# Patient Record
Sex: Female | Born: 1975 | Race: Black or African American | Hispanic: No | Smoking: Current every day smoker
Health system: Southern US, Community
[De-identification: ages and names within clinical notes are randomized; demographics above are authoritative.]

## PROBLEM LIST (undated history)

## (undated) DIAGNOSIS — F1991 Other psychoactive substance use, unspecified, in remission: Secondary | ICD-10-CM

## (undated) DIAGNOSIS — F99 Mental disorder, not otherwise specified: Secondary | ICD-10-CM

## (undated) DIAGNOSIS — Z87898 Personal history of other specified conditions: Secondary | ICD-10-CM

## (undated) DIAGNOSIS — O88219 Thromboembolism in pregnancy, unspecified trimester: Secondary | ICD-10-CM

---

## 2006-01-27 ENCOUNTER — Ambulatory Visit (HOSPITAL_COMMUNITY): Admission: RE | Admit: 2006-01-27 | Discharge: 2006-01-27 | Payer: Self-pay | Admitting: Family Medicine

## 2006-02-01 ENCOUNTER — Encounter: Admission: RE | Admit: 2006-02-01 | Discharge: 2006-02-01 | Payer: Self-pay | Admitting: Orthopaedic Surgery

## 2006-09-29 ENCOUNTER — Ambulatory Visit (HOSPITAL_COMMUNITY): Admission: RE | Admit: 2006-09-29 | Discharge: 2006-09-29 | Payer: Self-pay | Admitting: Obstetrics and Gynecology

## 2006-11-19 ENCOUNTER — Ambulatory Visit (HOSPITAL_COMMUNITY): Admission: RE | Admit: 2006-11-19 | Discharge: 2006-11-19 | Payer: Self-pay | Admitting: Orthopaedic Surgery

## 2006-11-26 ENCOUNTER — Ambulatory Visit: Payer: Self-pay | Admitting: Family Medicine

## 2006-11-30 ENCOUNTER — Encounter: Admission: RE | Admit: 2006-11-30 | Discharge: 2006-12-02 | Payer: Self-pay | Admitting: Orthopaedic Surgery

## 2008-07-17 ENCOUNTER — Ambulatory Visit (HOSPITAL_COMMUNITY): Admission: RE | Admit: 2008-07-17 | Discharge: 2008-07-17 | Payer: Self-pay | Admitting: Orthopaedic Surgery

## 2008-11-28 ENCOUNTER — Ambulatory Visit (HOSPITAL_COMMUNITY): Admission: RE | Admit: 2008-11-28 | Discharge: 2008-11-29 | Payer: Self-pay | Admitting: Neurosurgery

## 2008-11-28 HISTORY — PX: BACK SURGERY: SHX140

## 2009-08-02 ENCOUNTER — Ambulatory Visit (HOSPITAL_COMMUNITY): Admission: EM | Admit: 2009-08-02 | Discharge: 2009-08-02 | Payer: Self-pay | Admitting: Emergency Medicine

## 2009-08-03 ENCOUNTER — Emergency Department (HOSPITAL_COMMUNITY): Admission: EM | Admit: 2009-08-03 | Discharge: 2009-08-04 | Payer: Self-pay | Admitting: Emergency Medicine

## 2010-07-13 ENCOUNTER — Encounter: Payer: Self-pay | Admitting: Orthopaedic Surgery

## 2010-09-11 LAB — URINALYSIS, ROUTINE W REFLEX MICROSCOPIC
Glucose, UA: NEGATIVE mg/dL
Ketones, ur: NEGATIVE mg/dL
Nitrite: NEGATIVE
Protein, ur: NEGATIVE mg/dL
Urobilinogen, UA: 1 mg/dL (ref 0.0–1.0)

## 2010-09-11 LAB — BASIC METABOLIC PANEL
BUN: 12 mg/dL (ref 6–23)
Chloride: 106 mEq/L (ref 96–112)
Creatinine, Ser: 0.98 mg/dL (ref 0.4–1.2)
Glucose, Bld: 98 mg/dL (ref 70–99)
Potassium: 4.1 mEq/L (ref 3.5–5.1)

## 2010-09-11 LAB — CBC
HCT: 40.6 % (ref 36.0–46.0)
MCHC: 34.7 g/dL (ref 30.0–36.0)
MCV: 93.5 fL (ref 78.0–100.0)
Platelets: 289 10*3/uL (ref 150–400)
RDW: 13.4 % (ref 11.5–15.5)

## 2010-09-11 LAB — CULTURE, ROUTINE-ABSCESS: Gram Stain: NONE SEEN

## 2010-09-11 LAB — DIFFERENTIAL
Basophils Absolute: 0 10*3/uL (ref 0.0–0.1)
Basophils Relative: 0 % (ref 0–1)
Eosinophils Absolute: 0.3 10*3/uL (ref 0.0–0.7)
Eosinophils Relative: 3 % (ref 0–5)

## 2010-09-11 LAB — ANAEROBIC CULTURE

## 2010-09-11 LAB — URINE MICROSCOPIC-ADD ON

## 2010-09-29 LAB — CBC
Hemoglobin: 13.8 g/dL (ref 12.0–15.0)
MCHC: 34.6 g/dL (ref 30.0–36.0)
RBC: 4.32 MIL/uL (ref 3.87–5.11)

## 2010-11-04 NOTE — Op Note (Signed)
Alicia Hardy, Alicia Hardy                 ACCOUNT NO.:  [id number]   MEDICAL RECORD NO.:  [id number]          PATIENT TYPE:  INP   LOCATION:  3536                         FACILITY:  [facility]   PHYSICIAN:  Hilda Lias, M.D.   DATE OF BIRTH:  01-Aug-1975   DATE OF PROCEDURE:  11/28/2008  DATE OF DISCHARGE:                               OPERATIVE REPORT   PREOPERATIVE DIAGNOSIS:  Left L5-S1 herniated disk.   POSTOPERATIVE DIAGNOSIS:  Left L5-S1 herniated disk.   PROCEDURE:  Left L5-S1 diskectomy, foraminotomy, microscopy.   SURGEON:  Hilda Lias, M.D.   ASSISTANT:  Clydene Fake, M.D.   INDICATION:  [name] is a 35 year old female obese, complaining of  back and left leg pain.  The patient had several conservative treatment.  This problem has been going on for several months.  X-rays showed that  she has a herniated disk at the level of L5-S1.  Surgery was advised and  the risk was explained to her and her mother.   PROCEDURE:  The patient was taken to surgery.  She was taken to the  operative room.  After intubation, she was positioned in a prone manner.  The back was cleaned with DuraPrep.  X-ray with the needle as a marker  showed that we were at the level of L5-S1.  From then on, we opened the  skin.  There was a thick adipose tissue down to the fascia.  We  identified L5-S1 and the muscles were retracted in the left side.  We  brought the microscope into the area and we drilled the open lamina of  L5 and the open of S1.  The yellow ligament also was excised.  We found  the nerve root which was swollen and drainage.  Retraction was made and  there was a herniated disk with part of the disk going to the foramen.  There was a step-off.  Then, we entered disk space and total root  diskectomy medial and lateral was done.  At the end, we had good  decompression of the L5-S1 nerve root.  Valsalva maneuver was negative.  Depo-Medrol and Fentanyl were left in the epidural space and the  wound  was closed with Vicryl and Steri-Strips.           ______________________________  Hilda Lias, M.D.     EB/MEDQ  D:  11/28/2008  T:  11/29/2008  Job:  [id number]

## 2011-01-05 ENCOUNTER — Other Ambulatory Visit (HOSPITAL_COMMUNITY): Payer: Self-pay | Admitting: Obstetrics and Gynecology

## 2011-01-05 DIAGNOSIS — O2690 Pregnancy related conditions, unspecified, unspecified trimester: Secondary | ICD-10-CM

## 2011-01-05 DIAGNOSIS — O09891 Supervision of other high risk pregnancies, first trimester: Secondary | ICD-10-CM

## 2011-01-14 ENCOUNTER — Inpatient Hospital Stay (HOSPITAL_COMMUNITY): Admission: RE | Admit: 2011-01-14 | Payer: Self-pay | Source: Ambulatory Visit

## 2011-01-14 ENCOUNTER — Ambulatory Visit (HOSPITAL_COMMUNITY): Admission: RE | Admit: 2011-01-14 | Payer: Medicaid Other | Source: Ambulatory Visit

## 2011-01-21 ENCOUNTER — Other Ambulatory Visit (HOSPITAL_COMMUNITY): Payer: Self-pay | Admitting: Obstetrics and Gynecology

## 2011-01-21 DIAGNOSIS — O2690 Pregnancy related conditions, unspecified, unspecified trimester: Secondary | ICD-10-CM

## 2011-01-21 DIAGNOSIS — Z3689 Encounter for other specified antenatal screening: Secondary | ICD-10-CM

## 2011-01-22 ENCOUNTER — Ambulatory Visit (HOSPITAL_COMMUNITY)
Admission: RE | Admit: 2011-01-22 | Discharge: 2011-01-22 | Disposition: A | Discharge status: Home / Self Care | Payer: Medicaid Other | Source: Ambulatory Visit | Attending: Obstetrics and Gynecology | Admitting: Obstetrics and Gynecology

## 2011-01-22 ENCOUNTER — Encounter (HOSPITAL_COMMUNITY): Payer: Self-pay

## 2011-01-22 ENCOUNTER — Other Ambulatory Visit (HOSPITAL_COMMUNITY): Payer: Self-pay | Admitting: Obstetrics and Gynecology

## 2011-01-22 DIAGNOSIS — Z3689 Encounter for other specified antenatal screening: Secondary | ICD-10-CM

## 2011-01-22 DIAGNOSIS — I2699 Other pulmonary embolism without acute cor pulmonale: Secondary | ICD-10-CM

## 2011-01-22 DIAGNOSIS — Z363 Encounter for antenatal screening for malformations: Secondary | ICD-10-CM | POA: Insufficient documentation

## 2011-01-22 DIAGNOSIS — F1910 Other psychoactive substance abuse, uncomplicated: Secondary | ICD-10-CM | POA: Insufficient documentation

## 2011-01-22 DIAGNOSIS — Z1389 Encounter for screening for other disorder: Secondary | ICD-10-CM | POA: Insufficient documentation

## 2011-01-22 DIAGNOSIS — O09529 Supervision of elderly multigravida, unspecified trimester: Secondary | ICD-10-CM | POA: Insufficient documentation

## 2011-01-22 DIAGNOSIS — O358XX0 Maternal care for other (suspected) fetal abnormality and damage, not applicable or unspecified: Secondary | ICD-10-CM | POA: Insufficient documentation

## 2011-01-22 DIAGNOSIS — O2690 Pregnancy related conditions, unspecified, unspecified trimester: Secondary | ICD-10-CM

## 2011-01-22 DIAGNOSIS — O88219 Thromboembolism in pregnancy, unspecified trimester: Secondary | ICD-10-CM

## 2011-01-22 HISTORY — DX: Thromboembolism in pregnancy, unspecified trimester: O88.219

## 2011-01-22 HISTORY — DX: Mental disorder, not otherwise specified: F99

## 2011-01-22 NOTE — Assessment & Plan Note (Addendum)
Continue drug free for remainder of pregnancy. Establish counseling, management of depression

## 2011-01-22 NOTE — Assessment & Plan Note (Signed)
Patient switched to unfractionated heparin 10,00 units at 8 hour intervals; has not received syringes and needles for administration.  Directed to contact office to obtain these ( suggest insulin needles and syringes and be instructed in drawing up heparin for adequate dose.  Will need APTT weekly to establish therapeutic levels (2.0-2.5 APTT upper normal), titrate heparin dose accordingly.  Stop heparin within 12 hours of anticipated delivery; may consider awaiting labor or induction of labor >39 weeks.  Past and present use of medications does not require automatic cesarean delivery; reserve for obstetic indications.

## 2011-01-22 NOTE — Assessment & Plan Note (Signed)
Counseled about warfarin exposure in pregnancy, low but real risk of fetal facial, cardiac anomalies Recommend conversion from therapeutic Lovenox to therapeutic unfractionated heparin, starting at 15,000 units q 8 hr at test to maintain APTT at 2.0-2.5 X upper normal lab values.

## 2011-01-22 NOTE — Assessment & Plan Note (Signed)
Counseled about risks of

## 2011-01-22 NOTE — Assessment & Plan Note (Addendum)
Counseled about warfarin exposure in pregnancy with U/S assessment of fetus. Continue therapeutic Lovenox 1 mg/kg during pregnancy and 6 weeks postpartum

## 2011-01-22 NOTE — Progress Notes (Signed)
Alicia Hardy is a 35 y.o. female patient.  1. Encounter for fetal anatomic survey   2. Complicated pregnancy   3. Pulmonary embolism     Past Medical History  Diagnosis Date  . Mental disorder   . Pulmonary embolus in pregnancy childbirth or puerperium     Noted: 01/22/2011    Diagnosed PE 08/2010, used warfarin for about 4 months before discovery of pregnancy into 2nd trimester.  Presently using Lovenox 1 mg/kg without problems. 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 None used since two months before consultation  Patient has stated depression component to her substance abuse, erroneously thought street benzodiazepines were to help depression. Has not had assessment for depression   Current Outpatient Prescriptions  Medication Sig Dispense Refill  . Enoxaparin Sodium (LOVENOX [place]) Inject 90 mg into the skin 2 (two) times daily.        Marland Kitchen PRENATAL VITAMINS PO Take by mouth.         No Known Allergies Active Problems:  * No active hospital problems. *   There were no vitals taken for this visit.  Review of Systems  Constitutional: Negative.   HENT: Negative.   Eyes: Negative.   Respiratory: Negative.   Cardiovascular: Negative.   Gastrointestinal: Negative.   Genitourinary: Negative.   Musculoskeletal: Negative.   Skin: Negative.   Neurological: Negative.   Endo/Heme/Allergies: Negative.   Psychiatric/Behavioral: Positive for depression.  All other systems reviewed and are negative.    Physical Exam  Nursing note reviewed.   Patient switched to unfractionated heparin 10,000 units at 8 hour intervals; has not received syringes and needles for administration. Directed to contact office to obtain these ( suggest insulin needles and syringes and be instructed in drawing up heparin for adequate dose. Will need APTT weekly to establish therapeutic levels (2.0-2.5 APTT upper normal), titrate heparin dose accordingly. Stop heparin within 12  hours of anticipated delivery; may consider awaiting labor or induction of labor >39 weeks. Past and present use of medications does not require automatic cesarean delivery; reserve for obstetic indications.   Continue drug free for remainder of pregnancy.  Establish counseling, management of depression       Brytni Dray,JOE 01/22/2011

## 2011-03-21 ENCOUNTER — Emergency Department (HOSPITAL_COMMUNITY)
Admission: EM | Admit: 2011-03-21 | Discharge: 2011-03-21 | Disposition: A | Discharge status: Home / Self Care | Payer: Medicaid Other | Attending: Emergency Medicine | Admitting: Emergency Medicine

## 2011-03-21 DIAGNOSIS — R071 Chest pain on breathing: Secondary | ICD-10-CM | POA: Insufficient documentation

## 2011-03-21 DIAGNOSIS — Z79899 Other long term (current) drug therapy: Secondary | ICD-10-CM | POA: Insufficient documentation

## 2011-03-21 DIAGNOSIS — Z86711 Personal history of pulmonary embolism: Secondary | ICD-10-CM | POA: Insufficient documentation

## 2011-03-21 DIAGNOSIS — R0989 Other specified symptoms and signs involving the circulatory and respiratory systems: Secondary | ICD-10-CM | POA: Insufficient documentation

## 2011-03-21 DIAGNOSIS — Z7901 Long term (current) use of anticoagulants: Secondary | ICD-10-CM | POA: Insufficient documentation

## 2011-03-21 DIAGNOSIS — F17200 Nicotine dependence, unspecified, uncomplicated: Secondary | ICD-10-CM | POA: Insufficient documentation

## 2011-03-21 DIAGNOSIS — R0609 Other forms of dyspnea: Secondary | ICD-10-CM | POA: Insufficient documentation

## 2011-03-21 DIAGNOSIS — R1013 Epigastric pain: Secondary | ICD-10-CM | POA: Insufficient documentation

## 2011-03-21 DIAGNOSIS — R209 Unspecified disturbances of skin sensation: Secondary | ICD-10-CM | POA: Insufficient documentation

## 2011-03-21 LAB — POCT I-STAT, CHEM 8
Chloride: 107 mEq/L (ref 96–112)
Creatinine, Ser: 0.8 mg/dL (ref 0.50–1.10)
Glucose, Bld: 100 mg/dL — ABNORMAL HIGH (ref 70–99)
Potassium: 3.4 mEq/L — ABNORMAL LOW (ref 3.5–5.1)

## 2011-03-21 LAB — APTT: aPTT: 32 seconds (ref 24–37)

## 2011-04-07 ENCOUNTER — Encounter (HOSPITAL_COMMUNITY): Payer: Self-pay

## 2013-05-22 DIAGNOSIS — M5417 Radiculopathy, lumbosacral region: Secondary | ICD-10-CM | POA: Insufficient documentation

## 2013-06-09 ENCOUNTER — Other Ambulatory Visit: Payer: Self-pay | Admitting: Neurosurgery

## 2013-06-09 DIAGNOSIS — M5416 Radiculopathy, lumbar region: Secondary | ICD-10-CM

## 2013-06-27 ENCOUNTER — Ambulatory Visit
Admission: RE | Admit: 2013-06-27 | Discharge: 2013-06-27 | Disposition: A | Discharge status: Home / Self Care | Payer: Medicaid Other | Source: Ambulatory Visit | Attending: Neurosurgery | Admitting: Neurosurgery

## 2013-06-27 DIAGNOSIS — M5416 Radiculopathy, lumbar region: Secondary | ICD-10-CM

## 2013-06-27 MED ORDER — GADOBENATE DIMEGLUMINE 529 MG/ML IV SOLN
20.0000 mL | Freq: Once | INTRAVENOUS | Status: AC | PRN
Start: 2013-06-27 — End: 2013-06-27
  Administered 2013-06-27: 20 mL via INTRAVENOUS

## 2013-08-10 DIAGNOSIS — M5414 Radiculopathy, thoracic region: Secondary | ICD-10-CM | POA: Insufficient documentation

## 2013-12-06 DIAGNOSIS — M545 Low back pain, unspecified: Secondary | ICD-10-CM | POA: Insufficient documentation

## 2014-04-23 ENCOUNTER — Encounter (HOSPITAL_COMMUNITY): Payer: Self-pay

## 2014-06-08 ENCOUNTER — Other Ambulatory Visit: Payer: Self-pay | Admitting: Neurosurgery

## 2014-06-08 DIAGNOSIS — M5417 Radiculopathy, lumbosacral region: Secondary | ICD-10-CM

## 2014-07-18 ENCOUNTER — Ambulatory Visit
Admission: RE | Admit: 2014-07-18 | Discharge: 2014-07-18 | Disposition: A | Discharge status: Home / Self Care | Payer: Medicaid Other | Source: Ambulatory Visit | Attending: Neurosurgery | Admitting: Neurosurgery

## 2014-07-18 DIAGNOSIS — M5417 Radiculopathy, lumbosacral region: Secondary | ICD-10-CM

## 2014-07-18 MED ORDER — IOHEXOL 180 MG/ML  SOLN
17.0000 mL | Freq: Once | INTRAMUSCULAR | Status: AC | PRN
  Administered 2014-07-18: 17 mL via INTRAVENOUS

## 2014-07-18 MED ORDER — DIAZEPAM 5 MG PO TABS
10.0000 mg | ORAL_TABLET | Freq: Once | ORAL | Status: AC
  Administered 2014-07-18: 10 mg via ORAL

## 2014-07-18 MED ORDER — MEPERIDINE HCL 100 MG/ML IJ SOLN
50.0000 mg | Freq: Once | INTRAMUSCULAR | Status: AC
  Administered 2014-07-18: 50 mg via INTRAMUSCULAR

## 2014-07-18 MED ORDER — ONDANSETRON HCL 4 MG/2ML IJ SOLN
4.0000 mg | Freq: Once | INTRAMUSCULAR | Status: AC
  Administered 2014-07-18: 4 mg via INTRAMUSCULAR

## 2014-07-18 NOTE — Discharge Instructions (Signed)

## 2014-07-19 ENCOUNTER — Other Ambulatory Visit: Payer: Medicaid Other

## 2018-07-21 ENCOUNTER — Other Ambulatory Visit: Payer: Self-pay | Admitting: Neurological Surgery

## 2018-07-21 DIAGNOSIS — M5416 Radiculopathy, lumbar region: Secondary | ICD-10-CM

## 2018-08-10 ENCOUNTER — Ambulatory Visit
Admission: RE | Admit: 2018-08-10 | Discharge: 2018-08-10 | Disposition: A | Discharge status: Home / Self Care | Payer: Medicaid Other | Source: Ambulatory Visit | Attending: Neurological Surgery | Admitting: Neurological Surgery

## 2018-08-10 DIAGNOSIS — M5416 Radiculopathy, lumbar region: Secondary | ICD-10-CM

## 2018-08-10 MED ORDER — IOPAMIDOL (ISOVUE-M 200) INJECTION 41%
1.0000 mL | Freq: Once | INTRAMUSCULAR | Status: AC
  Administered 2018-08-10: 1 mL via EPIDURAL

## 2018-08-10 MED ORDER — METHYLPREDNISOLONE ACETATE 40 MG/ML INJ SUSP (RADIOLOG
120.0000 mg | Freq: Once | INTRAMUSCULAR | Status: AC
  Administered 2018-08-10: 120 mg via EPIDURAL

## 2018-08-10 NOTE — Discharge Instructions (Signed)

## 2019-01-23 ENCOUNTER — Other Ambulatory Visit: Payer: Self-pay | Admitting: Neurological Surgery

## 2019-01-23 DIAGNOSIS — M5481 Occipital neuralgia: Secondary | ICD-10-CM

## 2019-01-25 ENCOUNTER — Other Ambulatory Visit: Payer: Self-pay | Admitting: Neurological Surgery

## 2019-02-14 ENCOUNTER — Other Ambulatory Visit: Payer: Self-pay | Admitting: Neurological Surgery

## 2019-02-14 ENCOUNTER — Other Ambulatory Visit (HOSPITAL_COMMUNITY): Payer: Self-pay | Admitting: Neurological Surgery

## 2019-02-14 DIAGNOSIS — M4316 Spondylolisthesis, lumbar region: Secondary | ICD-10-CM

## 2019-03-01 ENCOUNTER — Ambulatory Visit (HOSPITAL_COMMUNITY)
Admission: RE | Admit: 2019-03-01 | Discharge: 2019-03-01 | Disposition: A | Discharge status: Home / Self Care | Payer: Medicaid Other | Source: Ambulatory Visit | Attending: Neurological Surgery | Admitting: Neurological Surgery

## 2019-03-01 ENCOUNTER — Other Ambulatory Visit: Payer: Self-pay

## 2019-03-01 DIAGNOSIS — M4316 Spondylolisthesis, lumbar region: Secondary | ICD-10-CM | POA: Insufficient documentation

## 2019-03-05 ENCOUNTER — Encounter (HOSPITAL_COMMUNITY): Payer: Self-pay | Admitting: *Deleted

## 2019-03-05 ENCOUNTER — Other Ambulatory Visit: Payer: Self-pay

## 2019-03-05 NOTE — Progress Notes (Signed)
Pre-op phone call complete.  Denies cardiologist.  No recent CP, fever, SOB, cough, fever, loss taste/smell.  No vitamins, fish oil, NSAIDs.  Instructed NPO after MN, no meds DOS, no deodorant, lotions, don't bring valuable.  Discussed visitor policy and that her children would not be allowed to visit or wait in waiting area. All questions answered, verbalized understanding of instructions.

## 2019-03-06 ENCOUNTER — Other Ambulatory Visit (HOSPITAL_COMMUNITY)
Admission: RE | Admit: 2019-03-06 | Discharge: 2019-03-06 | Disposition: A | Discharge status: Home / Self Care | Payer: Medicaid Other | Source: Ambulatory Visit | Attending: Neurological Surgery | Admitting: Neurological Surgery

## 2019-03-06 LAB — SARS CORONAVIRUS 2 (TAT 6-24 HRS): SARS Coronavirus 2: NEGATIVE

## 2019-03-07 ENCOUNTER — Inpatient Hospital Stay (HOSPITAL_COMMUNITY): Payer: Medicaid Other

## 2019-03-07 ENCOUNTER — Encounter (HOSPITAL_COMMUNITY)
Admission: RE | Disposition: A | Discharge status: Home / Self Care | Payer: Self-pay | Source: Home / Self Care | Attending: Neurological Surgery

## 2019-03-07 ENCOUNTER — Inpatient Hospital Stay (HOSPITAL_COMMUNITY): Payer: Medicaid Other | Admitting: Certified Registered Nurse Anesthetist

## 2019-03-07 ENCOUNTER — Other Ambulatory Visit: Payer: Self-pay

## 2019-03-07 ENCOUNTER — Encounter (HOSPITAL_COMMUNITY): Payer: Self-pay | Admitting: *Deleted

## 2019-03-07 ENCOUNTER — Inpatient Hospital Stay (HOSPITAL_COMMUNITY)
Admission: RE | Admit: 2019-03-07 | Discharge: 2019-03-08 | DRG: 460 | Disposition: A | Discharge status: Home Health | Payer: Medicaid Other | Attending: Neurological Surgery | Admitting: Neurological Surgery

## 2019-03-07 DIAGNOSIS — Z20828 Contact with and (suspected) exposure to other viral communicable diseases: Secondary | ICD-10-CM | POA: Diagnosis present

## 2019-03-07 DIAGNOSIS — M5416 Radiculopathy, lumbar region: Secondary | ICD-10-CM | POA: Diagnosis present

## 2019-03-07 DIAGNOSIS — M48061 Spinal stenosis, lumbar region without neurogenic claudication: Secondary | ICD-10-CM | POA: Diagnosis present

## 2019-03-07 DIAGNOSIS — Z86711 Personal history of pulmonary embolism: Secondary | ICD-10-CM

## 2019-03-07 DIAGNOSIS — F17210 Nicotine dependence, cigarettes, uncomplicated: Secondary | ICD-10-CM | POA: Diagnosis present

## 2019-03-07 DIAGNOSIS — M4316 Spondylolisthesis, lumbar region: Secondary | ICD-10-CM | POA: Diagnosis present

## 2019-03-07 DIAGNOSIS — Z419 Encounter for procedure for purposes other than remedying health state, unspecified: Secondary | ICD-10-CM

## 2019-03-07 HISTORY — DX: Personal history of other specified conditions: Z87.898

## 2019-03-07 HISTORY — PX: APPLICATION OF ROBOTIC ASSISTANCE FOR SPINAL PROCEDURE: SHX6753

## 2019-03-07 HISTORY — PX: TRANSFORAMINAL LUMBAR INTERBODY FUSION W/ MIS 1 LEVEL: SHX6145

## 2019-03-07 HISTORY — DX: Other psychoactive substance use, unspecified, in remission: F19.91

## 2019-03-07 LAB — CBC
HCT: 43.4 % (ref 36.0–46.0)
Hemoglobin: 14.2 g/dL (ref 12.0–15.0)
MCH: 32 pg (ref 26.0–34.0)
MCHC: 32.7 g/dL (ref 30.0–36.0)
MCV: 97.7 fL (ref 80.0–100.0)
Platelets: 299 10*3/uL (ref 150–400)
RBC: 4.44 MIL/uL (ref 3.87–5.11)
RDW: 13.6 % (ref 11.5–15.5)
WBC: 7.3 10*3/uL (ref 4.0–10.5)
nRBC: 0 % (ref 0.0–0.2)

## 2019-03-07 LAB — TYPE AND SCREEN
ABO/RH(D): A POS
Antibody Screen: NEGATIVE

## 2019-03-07 LAB — COMPREHENSIVE METABOLIC PANEL
ALT: 14 U/L (ref 0–44)
AST: 13 U/L — ABNORMAL LOW (ref 15–41)
Albumin: 3.6 g/dL (ref 3.5–5.0)
Alkaline Phosphatase: 73 U/L (ref 38–126)
Anion gap: 11 (ref 5–15)
BUN: 9 mg/dL (ref 6–20)
CO2: 23 mmol/L (ref 22–32)
Calcium: 8.9 mg/dL (ref 8.9–10.3)
Chloride: 105 mmol/L (ref 98–111)
Creatinine, Ser: 0.77 mg/dL (ref 0.44–1.00)
GFR calc Af Amer: >60 mL/min (ref >60)
GFR calc non Af Amer: >60 mL/min (ref >60)
Glucose, Bld: 97 mg/dL (ref 70–99)
Potassium: 3.3 mmol/L — ABNORMAL LOW (ref 3.5–5.1)
Sodium: 139 mmol/L (ref 135–145)
Total Bilirubin: 0.3 mg/dL (ref 0.3–1.2)
Total Protein: 6.6 g/dL (ref 6.5–8.1)

## 2019-03-07 LAB — POCT PREGNANCY, URINE: Preg Test, Ur: NEGATIVE

## 2019-03-07 LAB — ABO/RH: ABO/RH(D): A POS

## 2019-03-07 SURGERY — MINIMALLY INVASIVE (MIS) TRANSFORAMINAL LUMBAR INTERBODY FUSION (TLIF) 1 LEVEL
Anesthesia: General | Site: Spine Lumbar

## 2019-03-07 MED ORDER — LACTATED RINGERS IV SOLN
INTRAVENOUS | Status: DC | PRN
  Administered 2019-03-07 (×2): via INTRAVENOUS

## 2019-03-07 MED ORDER — ROCURONIUM BROMIDE 10 MG/ML (PF) SYRINGE
PREFILLED_SYRINGE | INTRAVENOUS | Status: AC
  Filled 2019-03-07: qty 10

## 2019-03-07 MED ORDER — ACETAMINOPHEN 160 MG/5ML PO SOLN: 1000.0000 mg | Freq: Once | ORAL | Status: DC | PRN

## 2019-03-07 MED ORDER — ONDANSETRON HCL 4 MG/2ML IJ SOLN
INTRAMUSCULAR | Status: AC
  Filled 2019-03-07: qty 2

## 2019-03-07 MED ORDER — SODIUM CHLORIDE 0.9 % IV SOLN
INTRAVENOUS | Status: DC | PRN
  Administered 2019-03-07: 20 ug/min via INTRAVENOUS

## 2019-03-07 MED ORDER — CHLORHEXIDINE GLUCONATE CLOTH 2 % EX PADS: 6.0000 | MEDICATED_PAD | Freq: Once | CUTANEOUS | Status: DC

## 2019-03-07 MED ORDER — CEFAZOLIN SODIUM-DEXTROSE 2-4 GM/100ML-% IV SOLN
INTRAVENOUS | Status: AC
  Filled 2019-03-07: qty 100

## 2019-03-07 MED ORDER — ACETAMINOPHEN 500 MG PO TABS: 1000.0000 mg | ORAL_TABLET | Freq: Once | ORAL | Status: DC | PRN

## 2019-03-07 MED ORDER — 0.9 % SODIUM CHLORIDE (POUR BTL) OPTIME
TOPICAL | Status: DC | PRN
  Administered 2019-03-07: 1000 mL

## 2019-03-07 MED ORDER — ROCURONIUM BROMIDE 50 MG/5ML IV SOSY
PREFILLED_SYRINGE | INTRAVENOUS | Status: DC | PRN
  Administered 2019-03-07 (×2): 20 mg via INTRAVENOUS
  Administered 2019-03-07 (×2): 10 mg via INTRAVENOUS
  Administered 2019-03-07: 20 mg via INTRAVENOUS
  Administered 2019-03-07: 50 mg via INTRAVENOUS

## 2019-03-07 MED ORDER — FENTANYL CITRATE (PF) 250 MCG/5ML IJ SOLN
INTRAMUSCULAR | Status: AC
  Filled 2019-03-07: qty 5

## 2019-03-07 MED ORDER — LIDOCAINE 2% (20 MG/ML) 5 ML SYRINGE
INTRAMUSCULAR | Status: DC | PRN
  Administered 2019-03-07: 80 mg via INTRAVENOUS

## 2019-03-07 MED ORDER — SUCCINYLCHOLINE CHLORIDE 200 MG/10ML IV SOSY
PREFILLED_SYRINGE | INTRAVENOUS | Status: AC
  Filled 2019-03-07: qty 10

## 2019-03-07 MED ORDER — LIDOCAINE 2% (20 MG/ML) 5 ML SYRINGE
INTRAMUSCULAR | Status: AC
  Filled 2019-03-07: qty 10

## 2019-03-07 MED ORDER — ACETAMINOPHEN 650 MG RE SUPP: 650.0000 mg | RECTAL | Status: DC | PRN

## 2019-03-07 MED ORDER — OXYCODONE HCL 5 MG PO TABS: 5.0000 mg | ORAL_TABLET | ORAL | Status: DC | PRN

## 2019-03-07 MED ORDER — DEXAMETHASONE SODIUM PHOSPHATE 10 MG/ML IJ SOLN
INTRAMUSCULAR | Status: AC
  Filled 2019-03-07: qty 1

## 2019-03-07 MED ORDER — OXYCODONE HCL 5 MG PO TABS
10.0000 mg | ORAL_TABLET | ORAL | Status: DC | PRN
  Administered 2019-03-07 – 2019-03-08 (×5): 10 mg via ORAL
  Filled 2019-03-07 (×5): qty 2

## 2019-03-07 MED ORDER — CEFAZOLIN SODIUM-DEXTROSE 2-4 GM/100ML-% IV SOLN
2.0000 g | INTRAVENOUS | Status: AC
  Administered 2019-03-07 (×2): 2 g via INTRAVENOUS

## 2019-03-07 MED ORDER — ONDANSETRON HCL 4 MG PO TABS: 4.0000 mg | ORAL_TABLET | Freq: Four times a day (QID) | ORAL | Status: DC | PRN

## 2019-03-07 MED ORDER — SUCCINYLCHOLINE CHLORIDE 200 MG/10ML IV SOSY
PREFILLED_SYRINGE | INTRAVENOUS | Status: DC | PRN
  Administered 2019-03-07: 110 mg via INTRAVENOUS

## 2019-03-07 MED ORDER — ONDANSETRON HCL 4 MG/2ML IJ SOLN
INTRAMUSCULAR | Status: DC | PRN
  Administered 2019-03-07: 4 mg via INTRAVENOUS

## 2019-03-07 MED ORDER — DOCUSATE SODIUM 100 MG PO CAPS
100.0000 mg | ORAL_CAPSULE | Freq: Two times a day (BID) | ORAL | Status: DC
  Administered 2019-03-07 – 2019-03-08 (×2): 100 mg via ORAL
  Filled 2019-03-07 (×2): qty 1

## 2019-03-07 MED ORDER — LIDOCAINE-EPINEPHRINE 1 %-1:100000 IJ SOLN
INTRAMUSCULAR | Status: DC | PRN
  Administered 2019-03-07: 4 mL

## 2019-03-07 MED ORDER — SODIUM CHLORIDE 0.9 % IV SOLN
INTRAVENOUS | Status: DC | PRN
  Administered 2019-03-07: 500 mL

## 2019-03-07 MED ORDER — FENTANYL CITRATE (PF) 250 MCG/5ML IJ SOLN
INTRAMUSCULAR | Status: DC | PRN
  Administered 2019-03-07 (×4): 50 ug via INTRAVENOUS
  Administered 2019-03-07: 100 ug via INTRAVENOUS

## 2019-03-07 MED ORDER — LIDOCAINE-EPINEPHRINE 1 %-1:100000 IJ SOLN
INTRAMUSCULAR | Status: AC
  Filled 2019-03-07: qty 1

## 2019-03-07 MED ORDER — ACETAMINOPHEN 10 MG/ML IV SOLN
INTRAVENOUS | Status: AC
  Filled 2019-03-07: qty 100

## 2019-03-07 MED ORDER — OXYCODONE HCL 5 MG PO TABS
ORAL_TABLET | ORAL | Status: AC
  Filled 2019-03-07: qty 1

## 2019-03-07 MED ORDER — LIDOCAINE 2% (20 MG/ML) 5 ML SYRINGE
INTRAMUSCULAR | Status: AC
  Filled 2019-03-07: qty 5

## 2019-03-07 MED ORDER — SODIUM CHLORIDE 0.9% FLUSH: 3.0000 mL | INTRAVENOUS | Status: DC | PRN

## 2019-03-07 MED ORDER — CEFAZOLIN SODIUM-DEXTROSE 2-4 GM/100ML-% IV SOLN
2.0000 g | Freq: Three times a day (TID) | INTRAVENOUS | Status: AC
  Administered 2019-03-07 – 2019-03-08 (×2): 2 g via INTRAVENOUS
  Filled 2019-03-07 (×2): qty 100

## 2019-03-07 MED ORDER — ACETAMINOPHEN 325 MG PO TABS
650.0000 mg | ORAL_TABLET | ORAL | Status: DC | PRN
  Administered 2019-03-07: 650 mg via ORAL
  Filled 2019-03-07: qty 2

## 2019-03-07 MED ORDER — HYDROMORPHONE HCL 1 MG/ML IJ SOLN
0.2500 mg | INTRAMUSCULAR | Status: AC | PRN
  Administered 2019-03-07 (×4): 0.5 mg via INTRAVENOUS

## 2019-03-07 MED ORDER — MENTHOL 3 MG MT LOZG: 1.0000 | LOZENGE | OROMUCOSAL | Status: DC | PRN

## 2019-03-07 MED ORDER — ALBUMIN HUMAN 5 % IV SOLN
INTRAVENOUS | Status: DC | PRN
  Administered 2019-03-07: 13:00:00 via INTRAVENOUS

## 2019-03-07 MED ORDER — HYDROMORPHONE HCL 1 MG/ML IJ SOLN
INTRAMUSCULAR | Status: AC
  Filled 2019-03-07: qty 1

## 2019-03-07 MED ORDER — OXYCODONE HCL 5 MG/5ML PO SOLN: 5.0000 mg | Freq: Once | ORAL | Status: AC | PRN

## 2019-03-07 MED ORDER — PROPOFOL 10 MG/ML IV BOLUS
INTRAVENOUS | Status: DC | PRN
  Administered 2019-03-07: 200 mg via INTRAVENOUS

## 2019-03-07 MED ORDER — LACTATED RINGERS IV SOLN
INTRAVENOUS | Status: DC
  Administered 2019-03-07 (×2): via INTRAVENOUS

## 2019-03-07 MED ORDER — SODIUM CHLORIDE 0.9% FLUSH
3.0000 mL | Freq: Two times a day (BID) | INTRAVENOUS | Status: DC
  Administered 2019-03-07: 21:00:00 3 mL via INTRAVENOUS

## 2019-03-07 MED ORDER — PHENYLEPHRINE 40 MCG/ML (10ML) SYRINGE FOR IV PUSH (FOR BLOOD PRESSURE SUPPORT)
PREFILLED_SYRINGE | INTRAVENOUS | Status: AC
  Filled 2019-03-07: qty 10

## 2019-03-07 MED ORDER — ONDANSETRON HCL 4 MG/2ML IJ SOLN: 4.0000 mg | Freq: Four times a day (QID) | INTRAMUSCULAR | Status: DC | PRN

## 2019-03-07 MED ORDER — METHOCARBAMOL 500 MG PO TABS
500.0000 mg | ORAL_TABLET | Freq: Four times a day (QID) | ORAL | Status: DC | PRN
  Administered 2019-03-07 – 2019-03-08 (×2): 500 mg via ORAL
  Filled 2019-03-07 (×2): qty 1

## 2019-03-07 MED ORDER — ONDANSETRON HCL 4 MG/2ML IJ SOLN
INTRAMUSCULAR | Status: AC
  Filled 2019-03-07: qty 4

## 2019-03-07 MED ORDER — PHENYLEPHRINE HCL (PRESSORS) 10 MG/ML IV SOLN
INTRAVENOUS | Status: DC | PRN
  Administered 2019-03-07: 40 ug via INTRAVENOUS
  Administered 2019-03-07 (×2): 80 ug via INTRAVENOUS
  Administered 2019-03-07: 40 ug via INTRAVENOUS

## 2019-03-07 MED ORDER — THROMBIN 5000 UNITS EX SOLR
OROMUCOSAL | Status: DC | PRN
  Administered 2019-03-07: 5 mL via TOPICAL

## 2019-03-07 MED ORDER — MIDAZOLAM HCL 5 MG/5ML IJ SOLN
INTRAMUSCULAR | Status: DC | PRN
  Administered 2019-03-07: 2 mg via INTRAVENOUS

## 2019-03-07 MED ORDER — FENTANYL CITRATE (PF) 100 MCG/2ML IJ SOLN: 25.0000 ug | INTRAMUSCULAR | Status: DC | PRN

## 2019-03-07 MED ORDER — HYDROMORPHONE HCL 1 MG/ML IJ SOLN: 0.5000 mg | INTRAMUSCULAR | Status: DC | PRN

## 2019-03-07 MED ORDER — OXYCODONE HCL 5 MG PO TABS
5.0000 mg | ORAL_TABLET | Freq: Once | ORAL | Status: AC | PRN
  Administered 2019-03-07: 5 mg via ORAL

## 2019-03-07 MED ORDER — THROMBIN 5000 UNITS EX SOLR
CUTANEOUS | Status: AC
  Filled 2019-03-07: qty 5000

## 2019-03-07 MED ORDER — SODIUM CHLORIDE 0.9 % IV SOLN
250.0000 mL | INTRAVENOUS | Status: DC
  Administered 2019-03-07: 250 mL via INTRAVENOUS

## 2019-03-07 MED ORDER — ROCURONIUM BROMIDE 10 MG/ML (PF) SYRINGE
PREFILLED_SYRINGE | INTRAVENOUS | Status: AC
  Filled 2019-03-07: qty 20

## 2019-03-07 MED ORDER — POLYETHYLENE GLYCOL 3350 17 G PO PACK: 17.0000 g | PACK | Freq: Every day | ORAL | Status: DC | PRN

## 2019-03-07 MED ORDER — DEXAMETHASONE SODIUM PHOSPHATE 10 MG/ML IJ SOLN
INTRAMUSCULAR | Status: DC | PRN
  Administered 2019-03-07: 5 mg via INTRAVENOUS

## 2019-03-07 MED ORDER — ACETAMINOPHEN 10 MG/ML IV SOLN
1000.0000 mg | Freq: Once | INTRAVENOUS | Status: DC | PRN
  Administered 2019-03-07: 1000 mg via INTRAVENOUS

## 2019-03-07 MED ORDER — MIDAZOLAM HCL 2 MG/2ML IJ SOLN
INTRAMUSCULAR | Status: AC
  Filled 2019-03-07: qty 2

## 2019-03-07 MED ORDER — BACLOFEN 10 MG PO TABS
10.0000 mg | ORAL_TABLET | Freq: Every day | ORAL | Status: DC
  Administered 2019-03-07: 10 mg via ORAL
  Filled 2019-03-07: qty 1

## 2019-03-07 MED ORDER — PHENOL 1.4 % MT LIQD: 1.0000 | OROMUCOSAL | Status: DC | PRN

## 2019-03-07 SURGICAL SUPPLY — 95 items
BAG DECANTER FOR FLEXI CONT (MISCELLANEOUS) ×4 IMPLANT
BASKET BONE COLLECTION (BASKET) ×2 IMPLANT
BIT DRILL LONG 3.0X30 (BIT) ×1 IMPLANT
BIT DRILL LONG 3.0X30MM (BIT) ×1
BIT DRILL LONG 3X80 (BIT) IMPLANT
BIT DRILL LONG 3X80MM (BIT)
BIT DRILL LONG 4X80 (BIT) IMPLANT
BIT DRILL LONG 4X80MM (BIT)
BIT DRILL SHORT 3.0X30 (BIT) IMPLANT
BIT DRILL SHORT 3.0X30MM (BIT)
BIT DRILL SHORT 3X80 (BIT) IMPLANT
BIT DRILL SHORT 3X80MM (BIT)
BLADE CLIPPER SURG (BLADE) IMPLANT
BLADE SURG 11 STRL SS (BLADE) ×6 IMPLANT
BUR MATCHSTICK NEURO 3.0 LAGG (BURR) ×2 IMPLANT
BUR PRECISION FLUTE 5.0 (BURR) ×2 IMPLANT
CATH FOLEY 2WAY SLVR  5CC 14FR (CATHETERS)
CATH FOLEY 2WAY SLVR  5CC 16FR (CATHETERS) ×2
CATH FOLEY 2WAY SLVR 5CC 14FR (CATHETERS) IMPLANT
CATH FOLEY 2WAY SLVR 5CC 16FR (CATHETERS) IMPLANT
CONT SPEC 4OZ CLIKSEAL STRL BL (MISCELLANEOUS) ×4 IMPLANT
COVER BACK TABLE 60X90IN (DRAPES) ×4 IMPLANT
COVER WAND RF STERILE (DRAPES) ×6 IMPLANT
DECANTER SPIKE VIAL GLASS SM (MISCELLANEOUS) ×4 IMPLANT
DERMABOND ADVANCED (GAUZE/BANDAGES/DRESSINGS) ×2
DERMABOND ADVANCED .7 DNX12 (GAUZE/BANDAGES/DRESSINGS) ×2 IMPLANT
DEVICE INTERBODY ELEVATE 23X8 (Cage) ×2 IMPLANT
DRAPE C-ARM 42X72 X-RAY (DRAPES) ×6 IMPLANT
DRAPE C-ARMOR (DRAPES) ×2 IMPLANT
DRAPE LAPAROTOMY 100X72X124 (DRAPES) ×4 IMPLANT
DRAPE MICROSCOPE LEICA (MISCELLANEOUS) ×2 IMPLANT
DRAPE SHEET LG 3/4 BI-LAMINATE (DRAPES) ×4 IMPLANT
DRAPE SURG 17X23 STRL (DRAPES) ×8 IMPLANT
ELECT BLADE 4.0 EZ CLEAN MEGAD (MISCELLANEOUS)
ELECT BLADE 6.5 EXT (BLADE) ×4 IMPLANT
ELECT REM PT RETURN 9FT ADLT (ELECTROSURGICAL) ×4
ELECTRODE BLDE 4.0 EZ CLN MEGD (MISCELLANEOUS) IMPLANT
ELECTRODE REM PT RTRN 9FT ADLT (ELECTROSURGICAL) ×2 IMPLANT
EXTENDER TAB GUIDE SV 5.5/6.0 (INSTRUMENTS) ×16 IMPLANT
GAUZE 4X4 16PLY RFD (DISPOSABLE) ×2 IMPLANT
GAUZE SPONGE 4X4 12PLY STRL (GAUZE/BANDAGES/DRESSINGS) ×4 IMPLANT
GLOVE BIO SURGEON STRL SZ7.5 (GLOVE) ×4 IMPLANT
GLOVE BIOGEL PI IND STRL 6.5 (GLOVE) IMPLANT
GLOVE BIOGEL PI IND STRL 7.0 (GLOVE) IMPLANT
GLOVE BIOGEL PI IND STRL 7.5 (GLOVE) ×2 IMPLANT
GLOVE BIOGEL PI IND STRL 8 (GLOVE) IMPLANT
GLOVE BIOGEL PI INDICATOR 6.5 (GLOVE) ×2
GLOVE BIOGEL PI INDICATOR 7.0 (GLOVE) ×6
GLOVE BIOGEL PI INDICATOR 7.5 (GLOVE) ×8
GLOVE BIOGEL PI INDICATOR 8 (GLOVE) ×2
GLOVE ECLIPSE 7.5 STRL STRAW (GLOVE) ×8 IMPLANT
GLOVE EXAM NITRILE LRG STRL (GLOVE) IMPLANT
GLOVE EXAM NITRILE XL STR (GLOVE) IMPLANT
GLOVE EXAM NITRILE XS STR PU (GLOVE) IMPLANT
GLOVE SURG SS PI 6.0 STRL IVOR (GLOVE) ×6 IMPLANT
GOWN STRL REUS W/ TWL LRG LVL3 (GOWN DISPOSABLE) ×2 IMPLANT
GOWN STRL REUS W/ TWL XL LVL3 (GOWN DISPOSABLE) IMPLANT
GOWN STRL REUS W/TWL 2XL LVL3 (GOWN DISPOSABLE) ×4 IMPLANT
GOWN STRL REUS W/TWL LRG LVL3 (GOWN DISPOSABLE) ×6
GOWN STRL REUS W/TWL XL LVL3 (GOWN DISPOSABLE) ×2
GUIDEWIRE 18IN BLUNT CD HORIZ (WIRE) ×8 IMPLANT
HEMOSTAT POWDER KIT SURGIFOAM (HEMOSTASIS) ×4 IMPLANT
KIT BASIN OR (CUSTOM PROCEDURE TRAY) ×4 IMPLANT
KIT POSITION SURG JACKSON T1 (MISCELLANEOUS) ×4 IMPLANT
KIT SPINE MAZOR X ROBO DISP (MISCELLANEOUS) ×4 IMPLANT
KIT TURNOVER KIT B (KITS) IMPLANT
NDL HYPO 18GX1.5 BLUNT FILL (NEEDLE) IMPLANT
NDL SPNL 18GX3.5 QUINCKE PK (NEEDLE) IMPLANT
NEEDLE HYPO 18GX1.5 BLUNT FILL (NEEDLE) IMPLANT
NEEDLE HYPO 22GX1.5 SAFETY (NEEDLE) ×4 IMPLANT
NEEDLE SPNL 18GX3.5 QUINCKE PK (NEEDLE) IMPLANT
NS IRRIG 1000ML POUR BTL (IV SOLUTION) ×4 IMPLANT
PACK LAMINECTOMY NEURO (CUSTOM PROCEDURE TRAY) ×4 IMPLANT
PAD ARMBOARD 7.5X6 YLW CONV (MISCELLANEOUS) ×8 IMPLANT
PIN HEAD 2.5X60MM (PIN) IMPLANT
ROD 5.5 CCM PERC 40 (Rod) ×2 IMPLANT
ROD 5.5X45MM SOLERA VOYAGER (Rod) ×2 IMPLANT
RUBBERBAND STERILE (MISCELLANEOUS) ×4 IMPLANT
SCREW MAS FENS 6.5 45 (Screw) IMPLANT
SCREW MAS FENS 6.5X45 (Screw) ×12 IMPLANT
SCREW SCHANZ SA 4.0MM (MISCELLANEOUS) IMPLANT
SCREW SET 5.5/6.0MM SOLERA (Screw) ×8 IMPLANT
SPACER SPNL STD 23X8XSTRL (Cage) IMPLANT
SPCR SPNL STD 23X8XSTRL (Cage) ×2 IMPLANT
SPONGE LAP 4X18 RFD (DISPOSABLE) IMPLANT
SUT MNCRL AB 3-0 PS2 18 (SUTURE) ×6 IMPLANT
SUT VIC AB 0 CT1 18XCR BRD8 (SUTURE) IMPLANT
SUT VIC AB 0 CT1 8-18 (SUTURE)
SUT VIC AB 2-0 CP2 18 (SUTURE) ×8 IMPLANT
SYR 3ML LL SCALE MARK (SYRINGE) IMPLANT
TOWEL GREEN STERILE (TOWEL DISPOSABLE) ×4 IMPLANT
TOWEL GREEN STERILE FF (TOWEL DISPOSABLE) ×4 IMPLANT
TRAY FOLEY MTR SLVR 16FR STAT (SET/KITS/TRAYS/PACK) ×2 IMPLANT
TUBE MAZOR SA REDUCTION (TUBING) ×4 IMPLANT
WATER STERILE IRR 1000ML POUR (IV SOLUTION) ×4 IMPLANT

## 2019-03-07 NOTE — Anesthesia Procedure Notes (Signed)
Procedure Name: Intubation Date/Time: 03/07/2019 10:49 AM Performed by: Glynda Jaeger, CRNA Pre-anesthesia Checklist: Patient identified, Patient being monitored, Timeout performed, Emergency Drugs available and Suction available Patient Re-evaluated:Patient Re-evaluated prior to induction Oxygen Delivery Method: Circle System Utilized Preoxygenation: Pre-oxygenation with 100% oxygen Induction Type: IV induction Laryngoscope Size: Glidescope and 4 Grade View: Grade I Tube type: Oral Tube size: 7.5 mm Number of attempts: 1 Airway Equipment and Method: Video-laryngoscopy Placement Confirmation: ETT inserted through vocal cords under direct vision,  positive ETCO2 and breath sounds checked- equal and bilateral Secured at: 21 cm Tube secured with: Tape Dental Injury: Teeth and Oropharynx as per pre-operative assessment

## 2019-03-07 NOTE — Progress Notes (Signed)
Orthopedic Tech Progress Note Patient Details:  Alicia Hardy 09/13/1975 [id number]  Patient ID: [name], female   DOB: 1976-04-18, 43 y.o.   MRN: [id number]   [name] 03/07/2019, 6:14 PMBio-Tech dropping brace off tomorrow  Morning.

## 2019-03-07 NOTE — Op Note (Signed)
PATIENT: Alicia Hardy  DAY OF SURGERY: 03/07/19   PRE-OPERATIVE DIAGNOSIS:  Lumbar radiculopathy, lumbar spondylolisthesis   POST-OPERATIVE DIAGNOSIS:  Lumbar radiculopathy, lumbar spondylolisthesis   PROCEDURE:  L4-L5 minimally invasive transforaminal lumbar interbody fusion with bilateral L4-L5 pedicle screw placement, L4 laminectomy with bilateral L4-5 facetectomies   SURGEON:  Surgeon(s) and Role:    Judith Part, MD - Primary   ANESTHESIA: ETGA   BRIEF HISTORY: This is a 43 year old woman who presented with bilateral leg and low back pain. The patient was found to have a mobile L4-5 spondylolisthesis with bilateral foraminal and lateral recess stenosis. This was discussed with the patient as well as risks, benefits, and alternatives and wished to proceed with surgical treatment.   OPERATIVE DETAIL: The patient was taken to the operating room and placed on the OR table in the prone position. A formal time out was performed with two patient identifiers and confirmed the operative site. Anesthesia was induced by the anesthesia team. The operative site was marked, hair was clipped with surgical clippers, the area was then prepped and draped in a sterile fashion.   The Mazor robot was attached to the table and draped in a sterile fashion. The robot was registered and, using fluoroscopy as needed, the four pedicle insertion sites were marked and incisions were created to connect those insertion sites. The robotic arm was used to help guide K wires into the bilateral L4 and L5 pedicles, which were then secured. The microscope was draped in a sterile fashion and brought into the field.   A [employer] tube was then docked to the right L4-5 facet through the same incision using fluoroscopy. A left L4-5 facetectomy was performed and the left L4 nerve root was decompressed along its entire course. The tube was wanded medially and the right lateral recess was decompressed. The tube was removed and  hemostasis was obtained during its removal.   A [employer] tube was then docked to the left L4-5 facet through the pedicle screw incision using fluoroscopy. A right L4-5 facetectomy was performed and the right L4 nerve root was decompressed along its entire course. The traversing and exiting nerve roots were identified and protected during all disc space work. The disc space was identified, incised, and a discectomy was performed in the standard fashion. The endplates were prepped, bone graft was packed into the disc space, and an expandable cage was packed with autograft and placed into the disc space with fluoroscopic confirmation. The tube was wanded medially and the left hemi-lamina was removed to decompress the lateral recess. The tube was removed and hemostasis was obtained during its removal.   Using the K wires, a tap and then screw with tower were placed bilaterally at L4 and L5. A rod was sized and introduced on both sides, confirmed with fluoroscopy, then final tightened. Hemostasis was again confirmed for both incisions, they were copiously irrigated, and then closed in layers.   All instrument and sponge counts were correct. The patient was then returned to anesthesia for emergence. No apparent complications at the completion of the procedure.   EBL:  128mL   DRAINS: none   SPECIMENS: none   Judith Part, MD 03/07/19 11:03 AM

## 2019-03-07 NOTE — Transfer of Care (Signed)
Immediate Anesthesia Transfer of Care Note  Patient: Alicia Hardy  Procedure(s) Performed: Left Lumbar four-five minimally invasive transforaminal lumbar interbody fusion with Lumbar four-five Bilateral pedicle screw placement (Left Spine Lumbar) APPLICATION OF ROBOTIC ASSISTANCE FOR SPINAL PROCEDURE (N/A Spine Lumbar)  Patient Location: PACU  Anesthesia Type:General  Level of Consciousness: awake, alert , oriented and patient cooperative  Airway & Oxygen Therapy: Patient Spontanous Breathing  Post-op Assessment: Report given to RN and Post -op Vital signs reviewed and stable  Post vital signs: Reviewed and stable  Last Vitals:  Vitals Value Taken Time  BP 126/79 03/07/19 1634  Temp    Pulse 80 03/07/19 1638  Resp 17 03/07/19 1638  SpO2 93 % 03/07/19 1638  Vitals shown include unvalidated device data.  Last Pain:  Vitals:   03/07/19 0917  TempSrc:   PainSc: 10-Worst pain ever      Patients Stated Pain Goal: 7 (25/00/37 0488)  Complications: No apparent anesthesia complications

## 2019-03-07 NOTE — Anesthesia Preprocedure Evaluation (Signed)
Anesthesia Evaluation  Patient identified by MRN, date of birth, ID band Patient awake    Reviewed: Allergy & Precautions, NPO status , Patient's Chart, lab work & pertinent test results  History of Anesthesia Complications Negative for: history of anesthetic complications  Airway Mallampati: II  TM Distance: >3 FB Neck ROM: Full    Dental  (+) Dental Advisory Given   Pulmonary Current Smoker and Patient abstained from smoking.,    breath sounds clear to auscultation       Cardiovascular negative cardio ROS   Rhythm:Regular     Neuro/Psych PSYCHIATRIC DISORDERS  Neuromuscular disease    GI/Hepatic negative GI ROS, (+)     substance abuse  cocaine use and marijuana use,   Endo/Other  negative endocrine ROS  Renal/GU negative Renal ROS     Musculoskeletal   Abdominal   Peds  Hematology negative hematology ROS (+)   Anesthesia Other Findings   Reproductive/Obstetrics                             Anesthesia Physical Anesthesia Plan  ASA: II  Anesthesia Plan: General   Post-op Pain Management:    Induction: Intravenous  PONV Risk Score and Plan: 2 and Ondansetron and Dexamethasone  Airway Management Planned: Oral ETT  Additional Equipment: None  Intra-op Plan:   Post-operative Plan: Extubation in OR  Informed Consent: I have reviewed the patients History and Physical, chart, labs and discussed the procedure including the risks, benefits and alternatives for the proposed anesthesia with the patient or authorized representative who has indicated his/her understanding and acceptance.     Dental advisory given  Plan Discussed with: CRNA and Surgeon  Anesthesia Plan Comments:         Anesthesia Quick Evaluation

## 2019-03-07 NOTE — Brief Op Note (Signed)
03/07/2019  4:28 PM  PATIENT:  Alicia Hardy  43 y.o. female  PRE-OPERATIVE DIAGNOSIS:  Spondylolisthesis, Lumbar region  POST-OPERATIVE DIAGNOSIS:  Spondylolisthesis, Lumbar region  PROCEDURE:  Procedure(s): Left Lumbar four-five minimally invasive transforaminal lumbar interbody fusion with Lumbar four-five Bilateral pedicle screw placement (Left) APPLICATION OF ROBOTIC ASSISTANCE FOR SPINAL PROCEDURE (N/A)  SURGEON:  Surgeon(s) and Role:    * Judith Part, MD - Primary  PHYSICIAN ASSISTANT:   ASSISTANTS: none   ANESTHESIA:   general  EBL:  175 mL   BLOOD ADMINISTERED:none  DRAINS: none   LOCAL MEDICATIONS USED:  LIDOCAINE   SPECIMEN:  No Specimen  DISPOSITION OF SPECIMEN:  N/A  COUNTS:  YES  TOURNIQUET:  * No tourniquets in log *  DICTATION: .Note written in EPIC  PLAN OF CARE: Admit to inpatient   PATIENT DISPOSITION:  PACU - hemodynamically stable.   Delay start of Pharmacological VTE agent (>24hrs) due to surgical blood loss or risk of bleeding: yes

## 2019-03-07 NOTE — H&P (Signed)
Surgical H&P Update  HPI: 43 y.o. woman with a history of low back and left leg pain, here for surgical treatment. Radiographic workup revealed a mobile grade 1 L4-5 spondylolisthesis with bilateral lateral recess stenosis and foraminal stenosis. No changes in health since she was last seen. Still having symptoms and wishes to proceed with surgery.  PMHx:  Past Medical History:  Diagnosis Date  . History of drug use   . Mental disorder   . Pulmonary embolus in pregnancy childbirth or puerperium    FamHx: History reviewed. No pertinent family history. SocHx:  reports that she has been smoking cigarettes. She has been smoking about 0.25 packs per day. She has never used smokeless tobacco. She reports current drug use. Drugs: Cocaine, Marijuana, and Benzodiazepines. She reports that she does not drink alcohol.  Physical Exam: AOx3, PERRL, FS, TM  Strength 5/5 x4, SILTx4 except for left L4 distribution numbness  Assesment/Plan: 43 y.o. woman with back and leg pain 2/2 L4-5 mobile spondylolisthesis, here for L4-5 MIS TLIF. Risks, benefits, and alternatives discussed and the patient would like to continue with surgery.  -OR today -3C post-op  Judith Part, MD 03/07/19 9:57 AM

## 2019-03-08 ENCOUNTER — Encounter (HOSPITAL_COMMUNITY): Payer: Self-pay | Admitting: Neurological Surgery

## 2019-03-08 MED ORDER — METHOCARBAMOL 500 MG PO TABS: 500.0000 mg | ORAL_TABLET | Freq: Four times a day (QID) | ORAL | 0 refills | Status: DC | PRN

## 2019-03-08 MED ORDER — OXYCODONE HCL 5 MG PO TABS: 5.0000 mg | ORAL_TABLET | ORAL | 0 refills | Status: DC | PRN

## 2019-03-08 NOTE — Progress Notes (Signed)
Neurosurgery Service Progress Note  Subjective: No acute events overnight, leg pain and back pain significantly improved, but feels that her foot numbness is worse post-op   Objective: Vitals:   03/07/19 2106 03/08/19 0004 03/08/19 0338 03/08/19 0741  BP: 133/90 (!) 156/82 136/85 107/68  Pulse: 68 64 66 62  Resp:  18 18 16   Temp: 98 F (36.7 C) 98 F (36.7 C) 98.1 F (36.7 C) 97.7 F (36.5 C)  TempSrc: Oral Oral Oral Oral  SpO2: 100% 96% 100% 98%  Weight:      Height:       Temp (24hrs), Avg:97.8 F (36.6 C), Min:97 F (36.1 C), Max:98.1 F (36.7 C)  CBC Latest Ref Rng & Units 03/07/2019 03/21/2011 08/02/2009  WBC 4.0 - 10.5 K/uL 7.3 - 9.0  Hemoglobin 12.0 - 15.0 g/dL 16.114.2 09.614.3 04.514.1  Hematocrit 36.0 - 46.0 % 43.4 42.0 40.6  Platelets 150 - 400 K/uL 299 - 289   BMP Latest Ref Rng & Units 03/07/2019 03/21/2011 08/02/2009  Glucose 70 - 99 mg/dL 97 409(W100(H) 98  BUN 6 - 20 mg/dL 9 11 12   Creatinine 0.44 - 1.00 mg/dL 1.190.77 1.470.80 8.290.98  Sodium 135 - 145 mmol/L 139 141 139  Potassium 3.5 - 5.1 mmol/L 3.3(L) 3.4(L) 4.1  Chloride 98 - 111 mmol/L 105 107 106  CO2 22 - 32 mmol/L 23 - 27  Calcium 8.9 - 10.3 mg/dL 8.9 - 9.3    Intake/Output Summary (Last 24 hours) at 03/08/2019 0913 Last data filed at 03/07/2019 2100 Gross per 24 hour  Intake 2450 ml  Output 680 ml  Net 1770 ml    Current Facility-Administered Medications:  .  0.9 %  sodium chloride infusion, 250 mL, Intravenous, Continuous, Ekta Dancer A, MD, Last Rate: 50 mL/hr at 03/07/19 1816, 250 mL at 03/07/19 1816 .  acetaminophen (TYLENOL) tablet 650 mg, 650 mg, Oral, Q4H PRN, 650 mg at 03/07/19 2312 **OR** acetaminophen (TYLENOL) suppository 650 mg, 650 mg, Rectal, Q4H PRN, Jadene Pierinistergard, Aleesha Ringstad A, MD .  baclofen (LIORESAL) tablet 10 mg, 10 mg, Oral, QHS, Bascom Biel, Clovis Puhomas A, MD, 10 mg at 03/07/19 2035 .  docusate sodium (COLACE) capsule 100 mg, 100 mg, Oral, BID, Jadene Pierinistergard, Kirsten Spearing A, MD, 100 mg at 03/07/19 2036 .   HYDROmorphone (DILAUDID) injection 0.5 mg, 0.5 mg, Intravenous, Q3H PRN, Alithea Lapage A, MD .  menthol-cetylpyridinium (CEPACOL) lozenge 3 mg, 1 lozenge, Oral, PRN **OR** phenol (CHLORASEPTIC) mouth spray 1 spray, 1 spray, Mouth/Throat, PRN, Damani Rando A, MD .  methocarbamol (ROBAXIN) tablet 500 mg, 500 mg, Oral, Q6H PRN, Jadene Pierinistergard, Yaslyn Cumby A, MD, 500 mg at 03/08/19 0636 .  ondansetron (ZOFRAN) tablet 4 mg, 4 mg, Oral, Q6H PRN **OR** ondansetron (ZOFRAN) injection 4 mg, 4 mg, Intravenous, Q6H PRN, Teila Skalsky A, MD .  oxyCODONE (Oxy IR/ROXICODONE) immediate release tablet 10 mg, 10 mg, Oral, Q4H PRN, Jadene Pierinistergard, Ota Ebersole A, MD, 10 mg at 03/08/19 [id number] .  oxyCODONE (Oxy IR/ROXICODONE) immediate release tablet 5 mg, 5 mg, Oral, Q4H PRN, Glynda Soliday A, MD .  polyethylene glycol (MIRALAX / GLYCOLAX) packet 17 g, 17 g, Oral, Daily PRN, Ceara Wrightson A, MD .  sodium chloride flush (NS) 0.9 % injection 3 mL, 3 mL, Intravenous, Q12H, Rayjon Wery, Clovis Puhomas A, MD, 3 mL at 03/07/19 2036 .  sodium chloride flush (NS) 0.9 % injection 3 mL, 3 mL, Intravenous, PRN, Jadene Pierinistergard, Maliah Pyles A, MD   Physical Exam: AOx3, PERRL, EOMI, FS, Strength 5/5 x4 except 4/5 in L DF, SILTx4  except for L L5 distribution numbness  Assessment & Plan: 43 y.o. woman s/p L4-5 MIS TLIF, recovering well.  -numbness likely 2/2 retraction during cage insertion, should improve without intervention -discharge home today with home PT/OT  Judith Part  03/08/19 9:13 AM

## 2019-03-08 NOTE — Discharge Summary (Signed)
Discharge Summary  Date of Admission: 03/07/2019  Date of Discharge: 03/08/19  Attending Physician: Emelda Brothers, MD  Hospital Course: Patient was admitted following an uncomplicated L2-4 MIS TLIF. She was recovered in PACU and transferred to [facility]. Her hospital course was uncomplicated and the patient was discharged home on 03/08/2019 with home PT and OT. She will follow up in clinic with me in 2 weeks.  Neurologic exam at discharge:  AOx3, PERRL, EOMI, FS, TM Strength 5/5 x4 except 4/5 in L DF, SILTx4 except L L5 distribution numbness  Discharge diagnosis: Lumbar radiculopathy  Judith Part, MD 03/08/19 9:15 AM

## 2019-03-08 NOTE — Discharge Instructions (Signed)
Discharge Instructions ° °No restriction in activities, slowly increase your activity back to normal.  ° °Your incision is closed with dermabond (purple glue). This will naturally fall off over the next 1-2 weeks.  ° °Okay to shower on the day of discharge. Use regular soap and water and try to be gentle when cleaning your incision.  ° °Follow up with Dr. Wm Sahagun in 2 weeks after discharge. If you do not already have a discharge appointment, please call his office at [phone] to schedule a follow up appointment. If you have any concerns or questions, please call the office and let us know. °

## 2019-03-08 NOTE — TOC Transition Note (Addendum)
Transition of Care [facility]) - CM/SW Discharge Note   Patient Details  Name: Alicia Hardy MRN: [id number] Date of Birth: August 30, 1975  [facility] CM/SW Contact:  Sharin Mons, RN Phone Number: 03/08/2019, 1:45 PM   Clinical Narrative:    S/P L4-5 MIS TLIF, 915/2020. Transition to home today with home health services to follow. Pt states mom to assist with care if needed once d/c. DME will be delivered to pt prior to d/c.  Alicia Hardy (Mother) Alicia Hardy (Daughter) Pt's cell     [phone]         Final next level of care: [place] Barriers to Discharge: No Barriers Identified   Patient Goals and CMS Choice Patient states their goals for this hospitalization and ongoing recovery are:: to get stronger   Choice offered to / list presented to : Patient  Discharge Placement                       Discharge Plan and Services                DME Arranged: 3-N-1, [place] rolling DME Agency: [employer] Date DME Agency Contacted: 03/08/19 Time DME Agency Contacted: 1470 Representative spoke with at DME Agency: given by floor nurse HH Arranged: PT, OT [place] Agency: [facility] (formerly [employer]) Date [place]: 03/08/19 Time [place]: [place] Representative spoke with at [place]: [place] ([place]) Interventions     Readmission Risk Interventions No flowsheet data found.

## 2019-03-08 NOTE — Plan of Care (Signed)
Patient alert and oriented, mae's well, voiding adequate amount of urine, swallowing without difficulty, no c/o pain at time of discharge. Patient discharged home with family. Script and discharged instructions given to patient. Patient and family stated understanding of instructions given. Patient has an appointment with Dr. Ostergard   

## 2019-03-08 NOTE — Evaluation (Signed)
Physical Therapy Evaluation Patient Details Name: Alicia Hardy MRN: [id number] DOB: [birth year] Today's Date: 03/08/2019   History of Present Illness  Admitted for L4-L5 minimally invasive transforaminal lumbar interbody fusion with bilateral L4-L5 pedicle screw placement, L4 laminectomy with bilateral L4-5 facetectomies for radiculopathy and spondylolisthesis;  has a past medical history of History of drug use, Mental disorder, and Pulmonary embolus in pregnancy childbirth or puerperium.  Clinical Impression   Patient evaluated by Physical Therapy with no further acute PT needs identified, as she is dc'ing home today. All education has been completed and the patient has no further questions. Motivated to work and get home; Heavy dependence on RW for support, but able to walk household distances, and go up and down stairs; Tells me she will have adequate assist at home;  See below for any follow-up Physical Therapy or equipment needs. PT is signing off. Thank you for this referral.     Follow Up Recommendations Home health PT    Equipment Recommendations  3in1 (PT)(tub bench)    Recommendations for Other Services       Precautions / Restrictions Precautions Precautions: Back Precaution Booklet Issued: Yes (comment) Precaution Comments: Reviwed precautions. No bending, lifting, or twisting Required Braces or Orthoses: Spinal Brace Spinal Brace: Lumbar corset      Mobility  Bed Mobility                  Transfers Overall transfer level: Needs assistance Equipment used: 4-wheeled walker Transfers: Sit to/from Stand Sit to Stand: Min guard         General transfer comment: pt required min guard A for safety and balance during sit<> stand; heavy dependence on UE support  Ambulation/Gait Ambulation/Gait assistance: Min guard Gait Distance (Feet): 120 Feet(2 bouts) Assistive device: 4-wheeled walker Gait Pattern/deviations: Step-through pattern;Decreased step length  - right;Decreased step length - left     General Gait Details: Heavy dependence on UE support from [employer] Stairs: Yes Stairs assistance: Min guard Stair Management: One rail Right;Step to pattern;Forwards(Descended sideways) Number of Stairs: 5 General stair comments: Effortful, but she did complete the task; tells me she will have planty of help  Wheelchair Mobility    Modified Rankin (Stroke Patients Only)       Balance     Sitting balance-Leahy Scale: Fair       Standing balance-Leahy Scale: Poor                               Pertinent Vitals/Pain Pain Assessment: Faces Faces Pain Scale: Hurts little more Pain Location: back Pain Descriptors / Indicators: Aching;Constant;Discomfort;Grimacing;Sore Pain Intervention(s): Monitored during session    Home Living Family/patient expects to be discharged to:: Private residence Living Arrangements: Parent;Children Available Help at Discharge: Family Type of Home: Mobile home Home Access: Stairs to enter Entrance Stairs-Rails: Right Entrance Stairs-Number of Steps: 5 Home Layout: One level Home Equipment: Walker - 4 wheels      Prior Function Level of Independence: Needs assistance   Gait / Transfers Assistance Needed: Uses rollator to walk  ADL's / Homemaking Assistance Needed: Pt reports that mother has been helping her to get dressed, bathed, and perform peri-care.         Hand Dominance   Dominant Hand: Right    Extremity/Trunk Assessment   Upper Extremity Assessment Upper Extremity Assessment: Defer to OT evaluation    Lower Extremity Assessment Lower Extremity Assessment: Generalized weakness  Cervical / Trunk Assessment Cervical / Trunk Assessment: Other exceptions Cervical / Trunk Exceptions: s/p back surgery  Communication   Communication: No difficulties  Cognition Arousal/Alertness: Awake/alert Behavior During Therapy: WFL for tasks  assessed/performed Overall Cognitive Status: Within Functional Limits for tasks assessed                                        General Comments      Exercises     Assessment/Plan    PT Assessment All further PT needs can be met in the next venue of care  PT Problem List Decreased strength;Decreased activity tolerance;Decreased balance;Decreased mobility;Decreased coordination;Decreased knowledge of use of DME;Decreased safety awareness;Decreased knowledge of precautions;Pain       PT Treatment Interventions      PT Goals (Current goals can be found in the Care Plan section)  Acute Rehab PT Goals Patient Stated Goal: go home PT Goal Formulation: All assessment and education complete, DC therapy    Frequency     Barriers to discharge        Co-evaluation               AM-PAC PT "6 Clicks" Mobility  Outcome Measure Help needed turning from your back to your side while in a flat bed without using bedrails?: A Little Help needed moving from lying on your back to sitting on the side of a flat bed without using bedrails?: A Little Help needed moving to and from a bed to a chair (including a wheelchair)?: A Little Help needed standing up from a chair using your arms (e.g., wheelchair or bedside chair)?: A Little Help needed to walk in hospital room?: A Little Help needed climbing 3-5 steps with a railing? : A Little 6 Click Score: 18    End of Session Equipment Utilized During Treatment: Back brace Activity Tolerance: Patient tolerated treatment well Patient left: in bed;with call bell/phone within reach(sitting EOB) Nurse Communication: Mobility status PT Visit Diagnosis: Unsteadiness on feet (R26.81);Other abnormalities of gait and mobility (R26.89);Muscle weakness (generalized) (M62.81)    Time: 8721-5872 PT Time Calculation (min) (ACUTE ONLY): 15 min   Charges:   PT Evaluation $PT Eval Moderate Complexity: 1 Mod          Alicia Hardy,  [place]  [facility] Pager [phone] Office [phone]   Alicia Hardy 03/08/2019, 4:10 PM

## 2019-03-08 NOTE — Progress Notes (Signed)
Occupational Therapy Evaluation Patient Details Name: Alicia Hardy MRN: [id number] DOB: 1976-01-03 Today's Date: 03/08/2019    History of Present Illness Admitted for L4-L5 minimally invasive transforaminal lumbar interbody fusion with bilateral L4-L5 pedicle screw placement, L4 laminectomy with bilateral L4-5 facetectomies for radiculopathy and spondylolisthesis;  has a past medical history of History of drug use, Mental disorder, and Pulmonary embolus in pregnancy childbirth or puerperium.   Clinical Impression   PTA, pt lived in mobile home with mother and daughter, and required assistance from mother for completing LB ADLs and toilet hygiene. Pt currently presents with decreased strength, balance, and increased pain, impacting her safe performance of ADLs. Provided education to pt regarding back precautions, AE for LB ADLs, toileting with AE, and functional transfers; pt verbalized and demonstrated understanding. Pt performed LB ADLs and functional with min guard A- min A for safety and balance. Pt experienced 1 episode of LOB while standing at sink, requiring min A to regain balance. Pt presented with poor activity tolerance, balance, and BLE strength requiring several seated rest breaks. Recommend dc home with HHOT to address deficits in balance, strength, and safe performance of ADLs. All acute needs met, defer further OT needs to next venue of care. Will sign off.      Follow Up Recommendations  Home health OT    Equipment Recommendations  3 in 1 bedside commode;Tub/shower bench    Recommendations for Other Services PT consult     Precautions / Restrictions Precautions Precautions: Back Precaution Booklet Issued: Yes (comment) Precaution Comments: Reviwed precautions. No bending, lifting, or twisting Required Braces or Orthoses: Spinal Brace Spinal Brace: Lumbar corset(Lumbar corset not present during session.) Restrictions Other Position/Activity Restrictions: Limited pt to  short mobility during session since lumbar corset not present      Mobility Bed Mobility Overal bed mobility: Needs Assistance Bed Mobility: Rolling;Sidelying to Sit;Sit to Sidelying Rolling: Min guard Sidelying to sit: Min assist     Sit to sidelying: Min assist General bed mobility comments: Pt required min guard A - min A for safety and maintaining back precautions  Transfers Overall transfer level: Needs assistance Equipment used: 4-wheeled walker Transfers: Sit to/from Stand Sit to Stand: Min guard         General transfer comment: pt required min guard A for safety and balance during sit<> stand    Balance Overall balance assessment: Needs assistance Sitting-balance support: Bilateral upper extremity supported;Feet supported Sitting balance-Leahy Scale: Fair     Standing balance support: Bilateral upper extremity supported;During functional activity Standing balance-Leahy Scale: Poor Standing balance comment: pt had episode of LOB during grooming task at sink, required min A to regain balance                           ADL either performed or assessed with clinical judgement   ADL Overall ADL's : Needs assistance/impaired Eating/Feeding: Independent   Grooming: Wash/dry hands;Applying deodorant;Supervision/safety;Sitting;Standing Grooming Details (indicate cue type and reason): Pt performed hand hygiene standing at sink, legs became tired and she sat on 3N1 to complete grooming tasks. Pt had episode of LOB during grooming, and required min A to regain balance.  Upper Body Bathing: Supervision/ safety;Sitting Upper Body Bathing Details (indicate cue type and reason): performed sitting on 3N1 in front of sink Lower Body Bathing: Moderate assistance;Sit to/from stand Lower Body Bathing Details (indicate cue type and reason): Pt required mod A for LB bathing. Provided education on AE to assist with  LB bathing. Pt verbalized use of AE Upper Body Dressing :  Supervision/safety;Sitting Upper Body Dressing Details (indicate cue type and reason): Pt performed UB dressing sitting EOB with supervision for safety and maintaining back precautions Lower Body Dressing: Minimal assistance;Sit to/from stand;With adaptive equipment;Min guard Lower Body Dressing Details (indicate cue type and reason): Provided education on use of AE for LB dressing. Pt donned LB clothing with use of reacher and sock aid with min guard A for safety. pt required min A for balance while standing Toilet Transfer: Minimal assistance;Ambulation;BSC Toilet Transfer Details (indicate cue type and reason): Pt required min A for safety and balance during toilet transfer [place] and Hygiene: Minimal assistance;Min guard;Cueing for compensatory techniques;Cueing for back precautions;Sit to/from stand Toileting - Clothing Manipulation Details (indicate cue type and reason): Pt required min A for peri-care and balance during clothing manipulation. Provided education on using toilet aid tongs for performing peri-care. Pt demonstrated use of toilet aid tongs.     Functional mobility during ADLs: Min guard;Rolling walker General ADL Comments: Pt has decreased strength and balance for completing ADLs, required min guard-min A for most ADLs. Provided education on use of AE for completing ADLs.     Vision Baseline Vision/History: (Pt reports that neck and face swelling due to back pain impa) Patient Visual Report: Other (comment)(Pt reports vision improving)       Perception     Praxis      Pertinent Vitals/Pain Pain Assessment: Faces Faces Pain Scale: Hurts little more Pain Location: back Pain Descriptors / Indicators: Aching;Constant;Discomfort;Grimacing;Sore Pain Intervention(s): Monitored during session;Repositioned;Limited activity within patient's tolerance     Hand Dominance Right   Extremity/Trunk Assessment Upper Extremity Assessment Upper Extremity  Assessment: Generalized weakness   Lower Extremity Assessment Lower Extremity Assessment: Defer to PT evaluation   Cervical / Trunk Assessment Cervical / Trunk Assessment: Other exceptions Cervical / Trunk Exceptions: s/p back surgery   Communication Communication Communication: No difficulties   Cognition Arousal/Alertness: Awake/alert Behavior During Therapy: WFL for tasks assessed/performed Overall Cognitive Status: Within Functional Limits for tasks assessed                                     General Comments       Exercises     Shoulder Instructions      Home Living Family/patient expects to be discharged to:: Private residence Living Arrangements: Parent;Children Available Help at Discharge: Family Type of Home: Mobile home Home Access: Stairs to enter Entrance Stairs-Number of Steps: 5 Entrance Stairs-Rails: Right Home Layout: One level     Bathroom Shower/Tub: Teacher, early years/pre: Standard     Home Equipment: Environmental consultant - 4 wheels          Prior Functioning/Environment Level of Independence: Needs assistance  Gait / Transfers Assistance Needed: Uses rollator to walk ADL's / Homemaking Assistance Needed: Pt reports that mother has been helping her to get dressed, bathed, and perform peri-care.             OT Problem List: Decreased strength;Decreased activity tolerance;Impaired balance (sitting and/or standing);Decreased knowledge of use of DME or AE;Decreased knowledge of precautions;Pain      OT Treatment/Interventions:      OT Goals(Current goals can be found in the care plan section) Acute Rehab OT Goals Patient Stated Goal: go home OT Goal Formulation: All assessment and education complete, DC therapy ADL Goals Pt Will  Perform Grooming: with modified independence;sitting Pt Will Perform Upper Body Dressing: with modified independence;sitting Pt Will Perform Lower Body Dressing: with modified independence;sit  to/from stand;with adaptive equipment Pt Will Transfer to Toilet: with supervision;ambulating;bedside commode Pt Will Perform Toileting - Clothing Manipulation and hygiene: with supervision;with adaptive equipment;sit to/from stand Pt Will Perform Tub/Shower Transfer: Tub transfer;with supervision;ambulating;tub bench;rolling walker Additional ADL Goal #1: Pt will verbalize 3/3 back precautions with less than 1 verbal cue Additional ADL Goal #2: Pt will perform bed mobility with mod I in preparation for ADLs.  OT Frequency:     Barriers to D/C:            Co-evaluation              AM-PAC OT "6 Clicks" Daily Activity     Outcome Measure Help from another person eating meals?: None Help from another person taking care of personal grooming?: A Little Help from another person toileting, which includes using toliet, bedpan, or urinal?: A Little Help from another person bathing (including washing, rinsing, drying)?: A Lot Help from another person to put on and taking off regular upper body clothing?: None Help from another person to put on and taking off regular lower body clothing?: A Little 6 Click Score: 19   End of Session Equipment Utilized During Treatment: Rolling walker Nurse Communication: Mobility status  Activity Tolerance: Patient limited by fatigue;Patient limited by pain Patient left: in bed;with call bell/phone within reach  OT Visit Diagnosis: Unsteadiness on feet (R26.81);History of falling (Z91.81);Muscle weakness (generalized) (M62.81);Pain Pain - part of body: (back)                Time: 9570-2202 OT Time Calculation (min): 38 min Charges:  OT General Charges $OT Visit: 1 Visit OT Evaluation $OT Eval Low Complexity: 1 Low OT Treatments $Self Care/Home Management : 23-37 mins  Gus Rankin, OT Student  Gus Rankin 03/08/2019, 1:40 PM

## 2019-03-09 NOTE — Anesthesia Postprocedure Evaluation (Signed)
Anesthesia Post Note  Patient: Alicia Hardy  Procedure(s) Performed: Left Lumbar four-five minimally invasive transforaminal lumbar interbody fusion with Lumbar four-five Bilateral pedicle screw placement (Left Spine Lumbar) APPLICATION OF ROBOTIC ASSISTANCE FOR SPINAL PROCEDURE (N/A Spine Lumbar)     Patient location during evaluation: PACU Anesthesia Type: General Level of consciousness: awake and alert Pain management: pain level controlled Vital Signs Assessment: post-procedure vital signs reviewed and stable Respiratory status: spontaneous breathing, nonlabored ventilation, respiratory function stable and patient connected to nasal cannula oxygen Cardiovascular status: blood pressure returned to baseline and stable Postop Assessment: no apparent nausea or vomiting Anesthetic complications: no    Last Vitals:  Vitals:   03/08/19 0338 03/08/19 0741  BP: 136/85 107/68  Pulse: 66 62  Resp: 18 16  Temp: 36.7 C 36.5 C  SpO2: 100% 98%    Last Pain:  Vitals:   03/08/19 1105  TempSrc:   PainSc: 4                  [name]

## 2019-03-23 DIAGNOSIS — H5710 Ocular pain, unspecified eye: Secondary | ICD-10-CM | POA: Insufficient documentation

## 2019-10-18 ENCOUNTER — Ambulatory Visit: Payer: Medicaid Other | Attending: Internal Medicine

## 2019-10-18 DIAGNOSIS — Z23 Encounter for immunization: Secondary | ICD-10-CM

## 2019-10-18 NOTE — Progress Notes (Signed)
   Covid-19 Vaccination Clinic  Name:  ALEXCIS BICKING    MRN: [id number] DOB: [birth year]  10/18/2019  Ms. Bresee was observed post Covid-19 immunization for 15 minutes without incident. She was provided with Vaccine Information Sheet and instruction to access the V-Safe system.   Ms. Lover was instructed to call 911 with any severe reactions post vaccine: Marland Kitchen Difficulty breathing  . Swelling of face and throat  . A fast heartbeat  . A bad rash all over body  . Dizziness and weakness   Immunizations Administered    Name Date Dose VIS Date Route   Moderna COVID-19 Vaccine 10/18/2019 11:48 AM 0.5 mL 05/2019 Intramuscular   Manufacturer: Moderna   Lot: [id number]   NDC: 53976-734-19

## 2019-11-15 ENCOUNTER — Ambulatory Visit: Payer: Medicaid Other | Attending: Internal Medicine

## 2019-11-15 DIAGNOSIS — Z23 Encounter for immunization: Secondary | ICD-10-CM

## 2019-11-15 NOTE — Progress Notes (Signed)
   Covid-19 Vaccination Clinic  Name:  Alicia Hardy    MRN: [id number] DOB: [birth year]  11/15/2019  Alicia Hardy was observed post Covid-19 immunization for 15 minutes without incident. She was provided with Vaccine Information Sheet and instruction to access the V-Safe system.   Alicia Hardy was instructed to call 911 with any severe reactions post vaccine: Marland Kitchen Difficulty breathing  . Swelling of face and throat  . A fast heartbeat  . A bad rash all over body  . Dizziness and weakness   Immunizations Administered    Name Date Dose VIS Date Route   Moderna COVID-19 Vaccine 11/15/2019 10:51 AM 0.5 mL 05/2019 Intramuscular   Manufacturer: Moderna   Lot: [id number]   NDC: 25852-778-24

## 2020-04-01 LAB — HM PAP SMEAR: HM Pap smear: NORMAL

## 2020-04-01 LAB — RESULTS CONSOLE HPV: CHL HPV: NEGATIVE

## 2020-04-01 LAB — HEPATITIS C ANTIBODY: Hepatitis C Ab: NEGATIVE

## 2021-01-07 DIAGNOSIS — M5481 Occipital neuralgia: Secondary | ICD-10-CM | POA: Insufficient documentation

## 2021-08-13 ENCOUNTER — Other Ambulatory Visit (HOSPITAL_COMMUNITY): Payer: Self-pay | Admitting: Physical Medicine & Rehabilitation

## 2021-08-13 ENCOUNTER — Other Ambulatory Visit: Payer: Self-pay | Admitting: Physical Medicine & Rehabilitation

## 2021-08-13 DIAGNOSIS — I7774 Dissection of vertebral artery: Secondary | ICD-10-CM

## 2021-08-23 ENCOUNTER — Other Ambulatory Visit: Payer: Self-pay

## 2021-08-23 ENCOUNTER — Ambulatory Visit (HOSPITAL_COMMUNITY)
Admission: RE | Admit: 2021-08-23 | Discharge: 2021-08-23 | Disposition: A | Discharge status: Home / Self Care | Payer: Medicaid Other | Source: Ambulatory Visit | Attending: Physical Medicine & Rehabilitation | Admitting: Physical Medicine & Rehabilitation

## 2021-08-23 DIAGNOSIS — I7774 Dissection of vertebral artery: Secondary | ICD-10-CM

## 2021-08-23 MED ORDER — GADOBUTROL 1 MMOL/ML IV SOLN
10.0000 mL | Freq: Once | INTRAVENOUS | Status: AC | PRN
  Administered 2021-08-23: 10 mL via INTRAVENOUS

## 2022-05-23 IMAGING — MR MR MRA HEAD W/O CM
1 series · 19 of 48 positions shown · IV contrast (gadavist)
Comparison: None.

CLINICAL DATA: Vertebral artery dissection

EXAM:
MRA NECK WITHOUT AND WITH CONTRAST
MRA HEAD WITHOUT CONTRAST
TECHNIQUE: Multiplanar and multiecho pulse sequences of the neck were obtained
without and with intravenous contrast. Angiographic images of the
neck were obtained using MRA technique without and with intravenous
contrast; Angiographic images of the Circle of Willis were obtained
using MRA technique without intravenous contrast.
CONTRAST:  10mL GADAVIST GADOBUTROL 1 MMOL/ML IV SOLN

[Series 5: 3d cow · axial · 0.5mm · 0.41mm/px · z∈[-119,-38]mm · 19 of 172 slices shown]
[im 1/172]
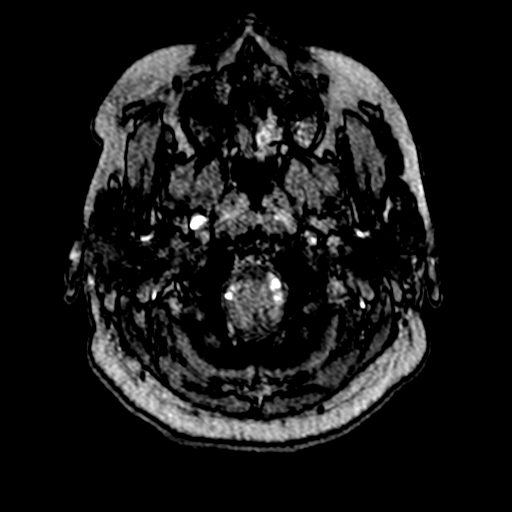
[im 4/172]
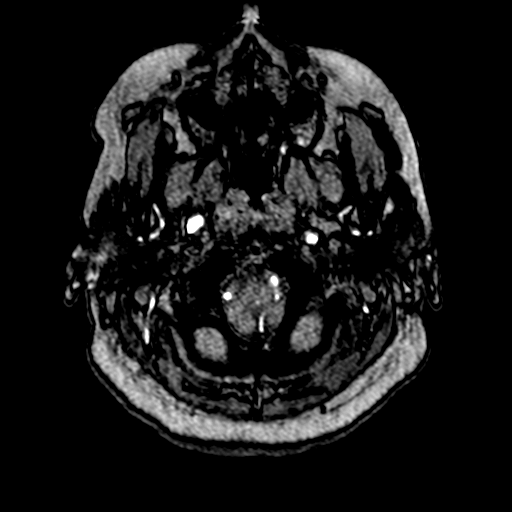
[im 8/172]
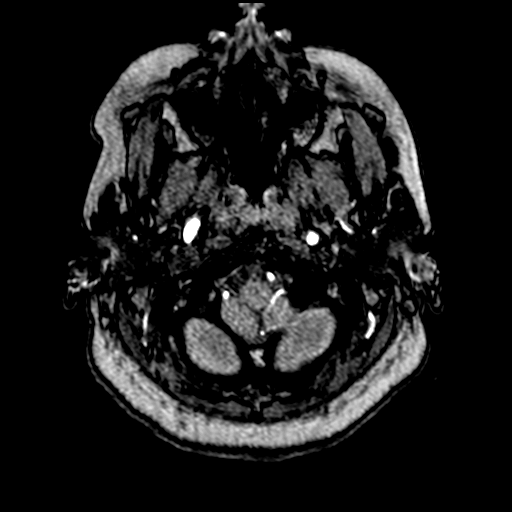
[im 11/172]
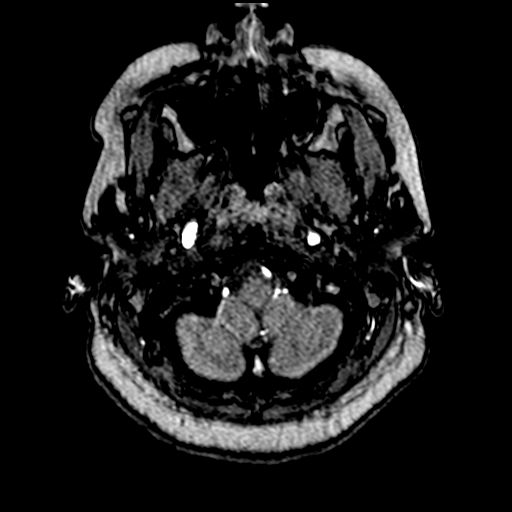
[im 15/172]
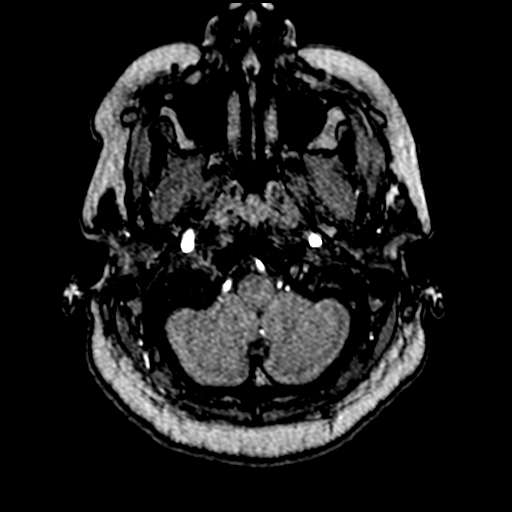
[im 19/172]
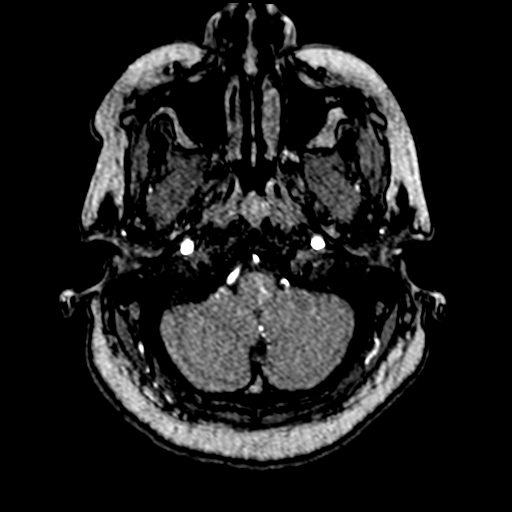
[im 22/172]
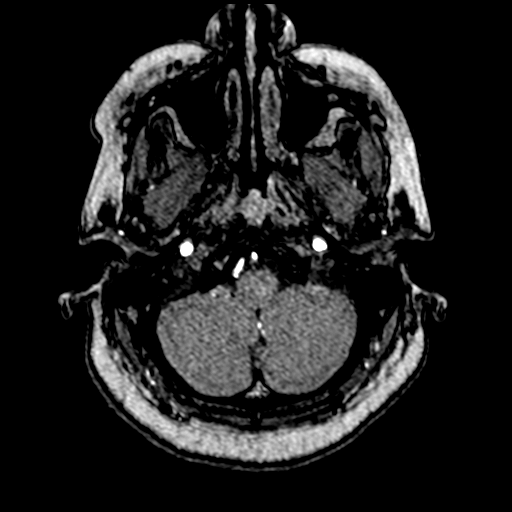
[im 26/172]
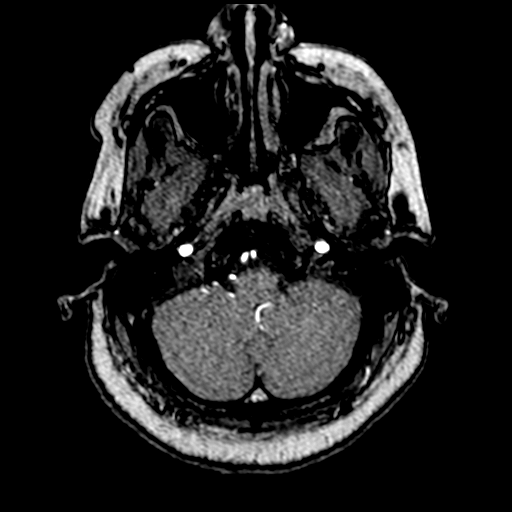
[im 30/172]
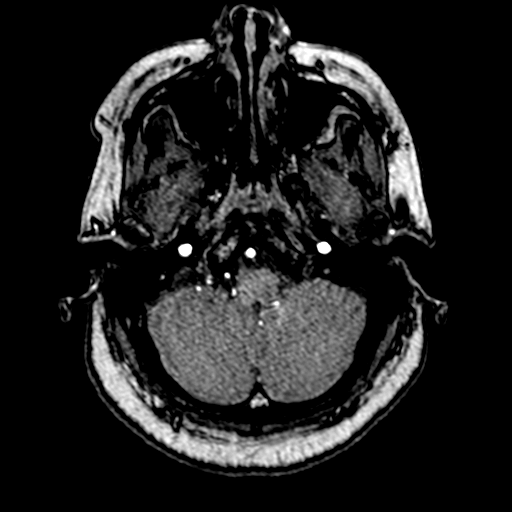
[im 33/172]
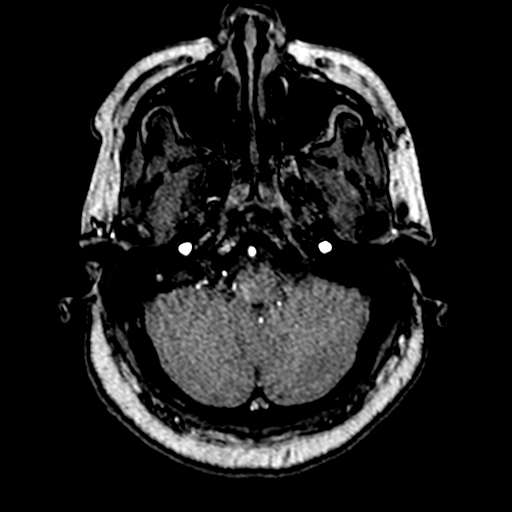
[im 37/172]
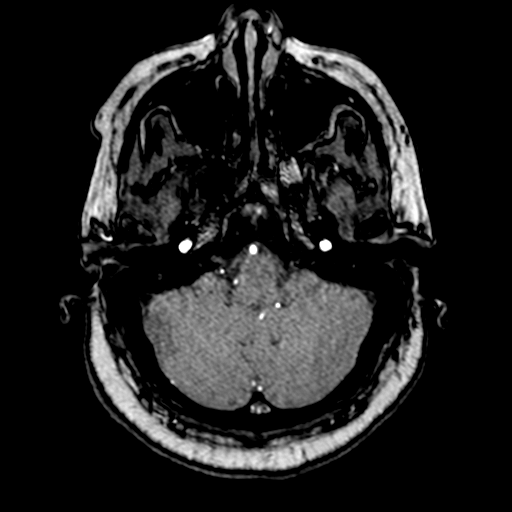
[im 55/172]
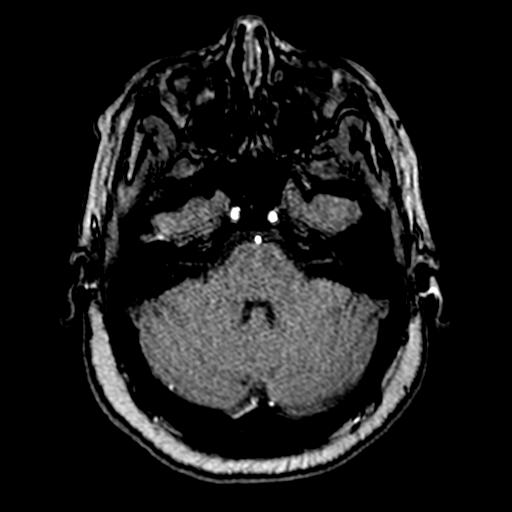
[im 77/172]
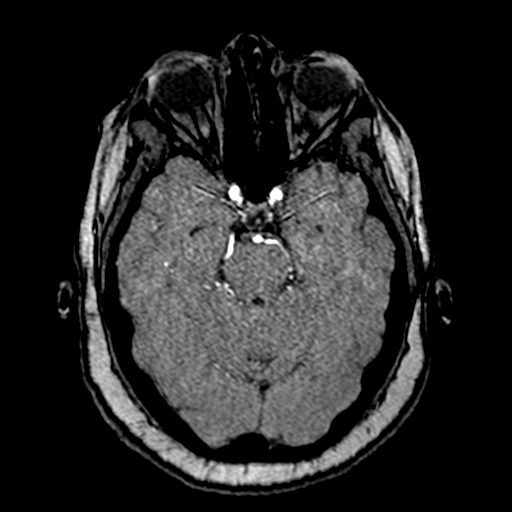
[im 88/172]
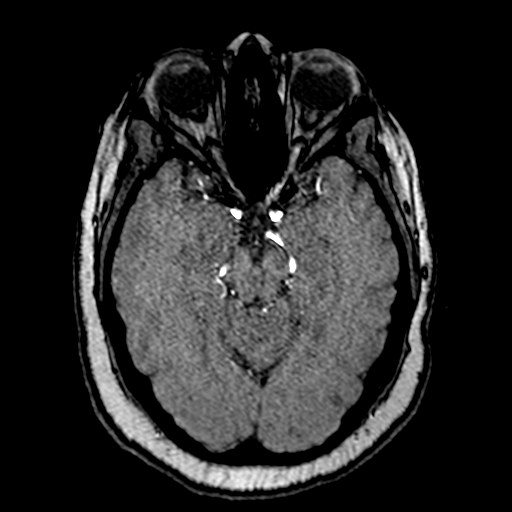
[im 99/172]
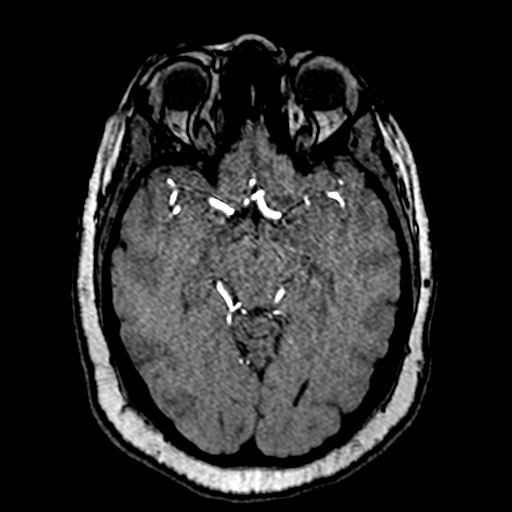
[im 121/172]
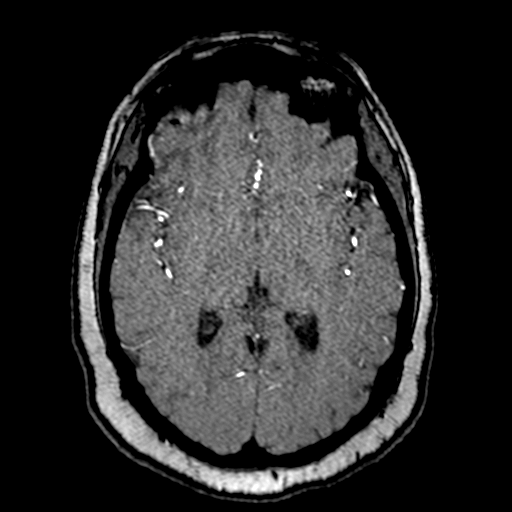
[im 142/172]
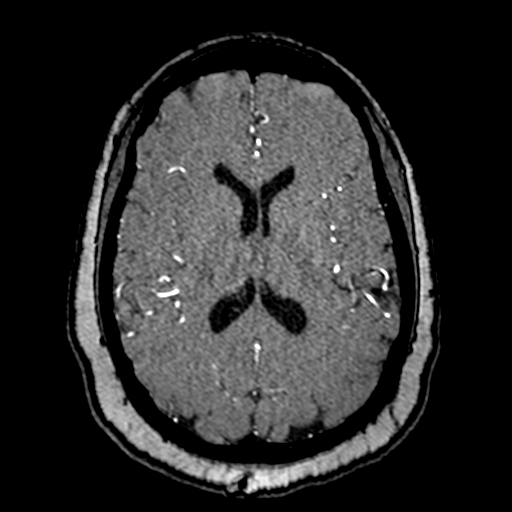
[im 146/172]
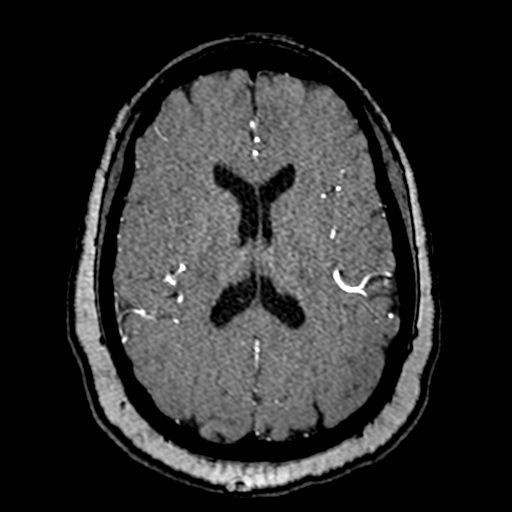
[im 164/172]
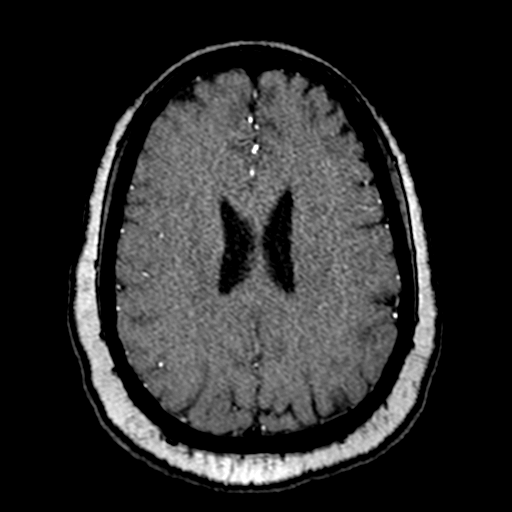

[19 of 48 positions shown; findings below may reference images not displayed]

FINDINGS: MRA NECK FINDINGS

Normal carotid and vertebral arteries. The vertebral system is
codominant. Visualized subclavian arteries are normal.

MRA HEAD FINDINGS

POSTERIOR CIRCULATION:

--Vertebral arteries: Normal

--Inferior cerebellar arteries: Normal.

--Basilar artery: Normal.

--Superior cerebellar arteries: Normal.

--Posterior cerebral arteries: Normal.

ANTERIOR CIRCULATION:

--Intracranial internal carotid arteries: Normal.

--Anterior cerebral arteries (ACA): Normal.

--Middle cerebral arteries (MCA): Normal.

ANATOMIC VARIANTS: Fetal origin of the right PCA.
IMPRESSION: Normal MRA of the head and neck.

## 2022-12-07 ENCOUNTER — Encounter: Payer: Self-pay | Admitting: Family Medicine

## 2022-12-07 ENCOUNTER — Ambulatory Visit: Payer: Medicaid Other | Admitting: Family Medicine

## 2022-12-07 VITALS — BP 141/84 | HR 89 | Temp 98.2°F | Resp 18 | Ht 67.0 in | Wt 241.8 lb

## 2022-12-07 DIAGNOSIS — M5020 Other cervical disc displacement, unspecified cervical region: Secondary | ICD-10-CM | POA: Diagnosis not present

## 2022-12-07 DIAGNOSIS — G8929 Other chronic pain: Secondary | ICD-10-CM

## 2022-12-07 DIAGNOSIS — R519 Headache, unspecified: Secondary | ICD-10-CM | POA: Diagnosis not present

## 2022-12-07 DIAGNOSIS — F39 Unspecified mood [affective] disorder: Secondary | ICD-10-CM | POA: Diagnosis not present

## 2022-12-07 MED ORDER — CYCLOBENZAPRINE HCL 10 MG PO TABS
10.0000 mg | ORAL_TABLET | Freq: Two times a day (BID) | ORAL | 1 refills | Status: DC | PRN
Start: 2022-12-07 — End: 2023-03-15

## 2022-12-07 MED ORDER — AMITRIPTYLINE HCL 25 MG PO TABS: 50.0000 mg | ORAL_TABLET | Freq: Every evening | ORAL | 1 refills | Status: DC | PRN

## 2022-12-07 MED ORDER — IBUPROFEN 800 MG PO TABS: 800.0000 mg | ORAL_TABLET | Freq: Three times a day (TID) | ORAL | 1 refills | Status: DC | PRN

## 2022-12-07 NOTE — Progress Notes (Signed)
Established Patient Office Visit  Subjective   Patient ID: Alicia Hardy, female    DOB: 12/03/1975  Age: 47 y.o. MRN: [id number]  Chief Complaint  Patient presents with   Chronic Condition follow up    Prior pt from [facility]. Last visit with provider 11/2021   Establish Care    Patient is here to establish care with PCP she states that she has concerns today regarding [place]    HPI  Mouth ulcers Pt reports she had blisters in her mouth. She was given a mouthwash to use. She reports this didn't work. It started getting worse. She was given steroids at an urgent care and told to follow up with a dentist. She had to have her upper teeth removed.   Neck pain Pt has chronic neck pain. She was using Flexeril 10mg  BID prn and Ibuprofen 800mg  TID prn. She also has headaches and was using Elail 25mg  2 tabs po at bedtime prn. She has had her eye  Mood Pt reports her mood has worsened. She says she use to use drugs since she was 47y.o up until her child was 71 years old. Her child is 12 now. She reports she can't drive now due to DUIs she had in the past. Her mother and oldest daughter brought her to appt today. She feels worse now being clean then when she was on drugs . She has a Dance movement psychotherapist and still going through counseling. She goes 2x a week AA. 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   All other systems reviewed and are negative.     Objective:     BP (!) 141/84   Pulse 89   Temp 98.2 F (36.8 C) (Oral)    Resp 18   Ht 5\' 7"  (1.702 m)   Wt 241 lb 12.8 oz (109.7 kg)   SpO2 100%   BMI 37.87 kg/m    Physical Exam Vitals and nursing note reviewed.  Constitutional:      Appearance: Normal appearance. She is normal weight.  HENT:     Head: Normocephalic and atraumatic.     Right Ear: External ear normal.     Left Ear: External ear normal.     Nose: Nose normal.  Cardiovascular:     Rate and Rhythm: Normal rate and regular rhythm.     Pulses: Normal pulses.     Heart sounds: Normal heart sounds.  Pulmonary:     Effort: Pulmonary effort is normal.     Breath sounds: Normal breath sounds.  Skin:    General: Skin is warm.     Capillary Refill: Capillary refill takes less than 2 seconds.  Neurological:     General: No focal deficit present.     Mental Status: She is alert and oriented to person, place, and time. Mental status is at baseline.  Psychiatric:        Behavior: Behavior normal.        Thought Content: Thought content normal.  Judgment: Judgment normal.     Comments: depressed      No results found for any visits on 12/07/22.    The 10-year ASCVD risk score (Arnett DK, et al., 2019) is: 3.6%    Assessment & Plan:   Problem List Items Addressed This Visit   None  Protruded cervical disc -     Amitriptyline HCl; Take 2 tablets (50 mg total) by mouth at bedtime as needed.  Dispense: 60 tablet; Refill: 1 -     Cyclobenzaprine HCl; Take 1 tablet (10 mg total) by mouth 2 (two) times daily as needed.  Dispense: 60 tablet; Refill: 1 -     Ibuprofen; Take 1 tablet (800 mg total) by mouth every 8 (eight) hours as needed.  Dispense: 90 tablet; Refill: 1  Chronic nonintractable headache, unspecified headache type -     Amitriptyline HCl; Take 2 tablets (50 mg total) by mouth at bedtime as needed.  Dispense: 60 tablet; Refill: 1 -     Ibuprofen; Take 1 tablet (800 mg total) by mouth every 8 (eight) hours as needed.  Dispense: 90 tablet; Refill: 1  Mood disorder  (HCC) -     Ambulatory referral to Behavioral Health   Pt with hx of cervical neck pain and headaches. Refilled Flexeril 10mg  BID prn and Elavil 25mg  2 tabs po at bedtime prn along with Ibuprofen 800mg  TID prn.  Pt with hx of drug abuse and mood disorder. Will refer to [facility] for further management and counseling.  See back in 6 weeks for physical No follow-ups on file.    Suzan Slick, MD

## 2023-01-02 ENCOUNTER — Other Ambulatory Visit: Payer: Self-pay | Admitting: Family Medicine

## 2023-01-02 DIAGNOSIS — R519 Headache, unspecified: Secondary | ICD-10-CM

## 2023-01-02 DIAGNOSIS — M5020 Other cervical disc displacement, unspecified cervical region: Secondary | ICD-10-CM

## 2023-01-18 ENCOUNTER — Ambulatory Visit (INDEPENDENT_AMBULATORY_CARE_PROVIDER_SITE_OTHER): Payer: Medicaid Other | Admitting: Family Medicine

## 2023-01-18 ENCOUNTER — Other Ambulatory Visit: Payer: Self-pay | Admitting: Family Medicine

## 2023-01-18 ENCOUNTER — Encounter: Payer: Self-pay | Admitting: Family Medicine

## 2023-01-18 VITALS — BP 137/84 | HR 86 | Temp 98.2°F | Resp 20 | Ht 67.0 in | Wt 242.1 lb

## 2023-01-18 DIAGNOSIS — Z3009 Encounter for other general counseling and advice on contraception: Secondary | ICD-10-CM

## 2023-01-18 DIAGNOSIS — Z13220 Encounter for screening for lipoid disorders: Secondary | ICD-10-CM

## 2023-01-18 DIAGNOSIS — Z1211 Encounter for screening for malignant neoplasm of colon: Secondary | ICD-10-CM

## 2023-01-18 DIAGNOSIS — Z0000 Encounter for general adult medical examination without abnormal findings: Secondary | ICD-10-CM | POA: Diagnosis not present

## 2023-01-18 DIAGNOSIS — R7302 Impaired glucose tolerance (oral): Secondary | ICD-10-CM

## 2023-01-18 DIAGNOSIS — Z1159 Encounter for screening for other viral diseases: Secondary | ICD-10-CM

## 2023-01-18 DIAGNOSIS — H9202 Otalgia, left ear: Secondary | ICD-10-CM

## 2023-01-18 DIAGNOSIS — Z136 Encounter for screening for cardiovascular disorders: Secondary | ICD-10-CM

## 2023-01-18 NOTE — Progress Notes (Signed)
Complete physical exam  Patient: Alicia Hardy   DOB: February 21, 1976   47 y.o. Female  MRN: [id number]  Subjective:    Chief Complaint  Patient presents with   Annual Exam    Alicia Hardy is a 47 y.o. female who presents today for a complete physical exam. She reports consuming a general diet.  Walking and yard work  She generally feels well. She reports sleeping well. She does have additional problems to discuss today.  Her BH referral was denied and she will need to stay in [facility]. She plans on going back to [facility]. She has 2 children 11 y.o and 41 y.o. She is interested in getting her tubes tied. She would like referral to OB. She has had left ear pain and would like to get referral to ENT. Been present on and off for months. Does drain sometimes.  She is up to date with pap smear, done in 2021 with normal pap and negative HPV. She needs referral for colonoscopy.  Most recent fall risk assessment:    12/07/2022   10:54 AM  Fall Risk   Falls in the past year? 0  Number falls in past yr: 0  Injury with Fall? 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   Occipital neuralgia 01/07/2021   Pain in eye 03/23/2019   Spondylolisthesis of lumbar region 03/07/2019   Chronic low back pain 12/06/2013   Thoracic radiculitis 08/10/2013   Lumbosacral radiculitis 05/22/2013   Drug abuse (HCC) 01/22/2011   Past Medical History:  Diagnosis Date   History of drug use    Mental disorder    Pulmonary embolus in pregnancy childbirth or puerperium    Past Surgical History:  Procedure Laterality Date   APPLICATION OF ROBOTIC ASSISTANCE FOR SPINAL PROCEDURE N/A 03/07/2019   Procedure: APPLICATION OF ROBOTIC  ASSISTANCE FOR SPINAL PROCEDURE;  Surgeon: Jadene Pierini, MD;  Location: [facility] OR;  Service: Neurosurgery;  Laterality: N/A;   BACK SURGERY Left 11/28/2008   L5-S1 diskectomy   TRANSFORAMINAL LUMBAR INTERBODY FUSION W/ MIS 1 LEVEL Left 03/07/2019   Procedure: Left Lumbar four-five minimally invasive transforaminal lumbar interbody fusion with Lumbar four-five Bilateral pedicle screw placement;  Surgeon: Jadene Pierini, MD;  Location: [facility] OR;  Service: Neurosurgery;  Laterality: Left;   Social History   Tobacco Use   Smoking status: Every Day    Current packs/day: 0.25    Types: Cigarettes   Smokeless tobacco: Never  Substance Use Topics   Alcohol use: No   Drug use: Yes    Types: Cocaine, Marijuana, Benzodiazepines    Comment: stopped drug use 2015   History reviewed. No pertinent family history. Allergies  Allergen Reactions   Bee Venom Anaphylaxis and Hives      Patient Care Team: Suzan Slick, MD as PCP - General (Family Medicine)   Outpatient Medications Prior to Visit  Medication Sig   amitriptyline (ELAVIL) 25 MG tablet Take 2 tablets (50 mg total) by mouth at bedtime as needed.   cetirizine (ZYRTEC) 10 MG tablet Take 10 mg by mouth daily.   cyclobenzaprine (FLEXERIL) 10 MG  tablet Take 1 tablet (10 mg total) by mouth 2 (two) times daily as needed.   hydrOXYzine (ATARAX) 25 MG tablet Take 25 mg by mouth every 8 (eight) hours as needed.   ibuprofen (ADVIL) 800 MG tablet TAKE ONE TABLET BY MOUTH EVERY EIGHT HOURS AS NEEDED   No facility-administered medications prior to visit.    Review of Systems  HENT:  Positive for ear pain.   All other systems reviewed and are negative.         Objective:     BP (!) 141/86   Pulse 86   Temp 98.2 F (36.8 C) (Oral)   Resp 20   Ht 5\' 7"  (1.702 m)   Wt 242 lb 1.6 oz (109.8 kg)   SpO2 100%   BMI 37.92 kg/m  BP Readings from Last 3 Encounters:  01/18/23 137/84  12/07/22 (!) 141/84  03/08/19 107/68       Physical Exam Vitals and nursing note reviewed.  Constitutional:      Appearance: Normal appearance. She is obese.  HENT:     Head: Normocephalic and atraumatic.     Right Ear: Tympanic membrane, ear canal and external ear normal.     Left Ear: External ear normal.     Ears:     Comments: Edematous internal ear canal, TM not visualized    Nose: Nose normal.     Mouth/Throat:     Mouth: Mucous membranes are moist.  Eyes:     Pupils: Pupils are equal, round, and reactive to light.  Cardiovascular:     Rate and Rhythm: Normal rate and regular rhythm.     Pulses: Normal pulses.     Heart sounds: Normal heart sounds.  Pulmonary:     Effort: Pulmonary effort is normal.     Breath sounds: Normal breath sounds.  Skin:    General: Skin is warm.     Capillary Refill: Capillary refill takes less than 2 seconds.  Neurological:     General: No focal deficit present.     Mental Status: She is alert and oriented to person, place, and time. Mental status is at baseline.  Psychiatric:        Mood and Affect: Mood normal.        Behavior: Behavior normal.        Thought Content: Thought content normal.        Judgment: Judgment normal.      Results for orders placed or performed in visit on 01/18/23  Hepatitis C antibody  Result Value Ref Range   Hepatitis C Ab Negative    Last CBC Lab Results  Component Value Date   WBC 7.3 03/07/2019   HGB 14.2 03/07/2019   HCT 43.4 03/07/2019   MCV 97.7 03/07/2019   MCH 32.0 03/07/2019   RDW 13.6 03/07/2019   PLT 299 03/07/2019   Last metabolic panel Lab Results  Component Value Date   GLUCOSE 97 03/07/2019   NA 139 03/07/2019   K 3.3 (L) 03/07/2019   CL 105 03/07/2019   CO2 23 03/07/2019   BUN 9 03/07/2019   CREATININE 0.77 03/07/2019   GFRNONAA >60 03/07/2019   CALCIUM 8.9 03/07/2019   PROT 6.6 03/07/2019   ALBUMIN 3.6 03/07/2019   BILITOT 0.3 03/07/2019   ALKPHOS 73 03/07/2019   AST 13 (L) 03/07/2019   ALT 14 03/07/2019    ANIONGAP 11 03/07/2019   Last lipids No results found for: "CHOL", "HDL", "LDLCALC", "LDLDIRECT", "TRIG", "CHOLHDL" Last  hemoglobin A1c No results found for: "HGBA1C"      Assessment & Plan:    Routine Health Maintenance and Physical Exam  Immunization History  Administered Date(s) Administered   Influenza Split 08/24/2010   Moderna Sars-Covid-2 Vaccination 10/18/2019, 11/15/2019   Td (Adult),5 Lf Tetanus Toxid, Preservative Free 10/03/1995   Tdap 04/01/2020    Health Maintenance  Topic Date Due   HIV Screening  Never done   Colonoscopy  Never done   COVID-19 Vaccine (3 - 2023-24 season) 02/20/2022   INFLUENZA VACCINE  01/21/2023   PAP SMEAR-Modifier  04/01/2025   DTaP/Tdap/Td (2 - Td or Tdap) 04/01/2030   Hepatitis C Screening  Completed   HPV VACCINES  Aged Out    Discussed health benefits of physical activity, and encouraged her to engage in regular exercise appropriate for her age and condition.  Problem List Items Addressed This Visit   None  No follow-ups on file. Annual physical exam  Family planning -     Ambulatory referral to Obstetrics / Gynecology  Ear pain, left -     Ambulatory referral to ENT  Encounter for lipid screening for cardiovascular disease -     Lipid panel  Impaired glucose tolerance -     CBC with Differential/Platelet -     Comprehensive metabolic panel -     Hemoglobin A1c  Screening for viral disease -     HIV Antibody (routine testing w rflx)  Screening for colon cancer -     Ambulatory referral to General Surgery   Screening labs Refer for colonoscopy Refer to ENT for left ear pain chronic. Refer to [facility] for possible tubal ligation.    Suzan Slick, MD

## 2023-01-26 ENCOUNTER — Telehealth: Payer: Self-pay | Admitting: Family Medicine

## 2023-01-26 NOTE — Telephone Encounter (Signed)
Dr. Avel Sensor office called stating that they are not in network for this patient to receive care. Please Advise

## 2023-01-27 ENCOUNTER — Other Ambulatory Visit: Payer: Self-pay | Admitting: Family Medicine

## 2023-01-27 DIAGNOSIS — R519 Headache, unspecified: Secondary | ICD-10-CM

## 2023-01-27 DIAGNOSIS — M5020 Other cervical disc displacement, unspecified cervical region: Secondary | ICD-10-CM

## 2023-02-11 NOTE — Telephone Encounter (Signed)
Referral and clinical notes have been faxed to [facility] through [facility]. Office will contact patient to schedule referral appointment.

## 2023-03-12 ENCOUNTER — Other Ambulatory Visit: Payer: Self-pay | Admitting: Family Medicine

## 2023-03-12 DIAGNOSIS — M5020 Other cervical disc displacement, unspecified cervical region: Secondary | ICD-10-CM

## 2023-04-05 ENCOUNTER — Telehealth: Payer: Self-pay | Admitting: Family Medicine

## 2023-04-05 ENCOUNTER — Encounter (INDEPENDENT_AMBULATORY_CARE_PROVIDER_SITE_OTHER): Payer: Self-pay | Admitting: Otolaryngology

## 2023-04-05 ENCOUNTER — Ambulatory Visit (INDEPENDENT_AMBULATORY_CARE_PROVIDER_SITE_OTHER): Payer: Medicaid Other | Admitting: Otolaryngology

## 2023-04-05 VITALS — BP 135/96 | HR 119 | Ht 67.0 in | Wt 237.0 lb

## 2023-04-05 DIAGNOSIS — J329 Chronic sinusitis, unspecified: Secondary | ICD-10-CM

## 2023-04-05 DIAGNOSIS — J3489 Other specified disorders of nose and nasal sinuses: Secondary | ICD-10-CM

## 2023-04-05 DIAGNOSIS — R0981 Nasal congestion: Secondary | ICD-10-CM | POA: Diagnosis not present

## 2023-04-05 DIAGNOSIS — J3089 Other allergic rhinitis: Secondary | ICD-10-CM

## 2023-04-05 DIAGNOSIS — H9202 Otalgia, left ear: Secondary | ICD-10-CM | POA: Diagnosis not present

## 2023-04-05 DIAGNOSIS — G8929 Other chronic pain: Secondary | ICD-10-CM

## 2023-04-05 DIAGNOSIS — M5020 Other cervical disc displacement, unspecified cervical region: Secondary | ICD-10-CM

## 2023-04-05 DIAGNOSIS — R519 Headache, unspecified: Secondary | ICD-10-CM

## 2023-04-05 DIAGNOSIS — M26622 Arthralgia of left temporomandibular joint: Secondary | ICD-10-CM

## 2023-04-05 MED ORDER — CYCLOBENZAPRINE HCL 10 MG PO TABS
10.0000 mg | ORAL_TABLET | Freq: Two times a day (BID) | ORAL | 1 refills | Status: DC | PRN
Start: 2023-04-05 — End: 2023-09-06

## 2023-04-05 MED ORDER — IBUPROFEN 800 MG PO TABS
800.0000 mg | ORAL_TABLET | Freq: Three times a day (TID) | ORAL | 1 refills | Status: DC | PRN
Start: 2023-04-05 — End: 2023-05-05

## 2023-04-05 MED ORDER — FLUTICASONE PROPIONATE 50 MCG/ACT NA SUSP: 2.0000 | Freq: Every day | NASAL | 6 refills | Status: DC

## 2023-04-05 MED ORDER — LORATADINE 10 MG PO TABS: 10.0000 mg | ORAL_TABLET | Freq: Every day | ORAL | 11 refills | Status: DC

## 2023-04-05 NOTE — Progress Notes (Signed)
ENT CONSULT:  Reason for Consult: left ear pain and chronic nasal congestion  HPI: Alicia Hardy is an 47 y.o. female with hx of long-standing environmental allergies, migraine headaches, here for evaluation of persistent left er pain for 12 months and recurrent sinus infections.  Left ear pain for over a year, nearly every day. She had antibiotics and steroids 2 weeks ago. Did not seem to relieve her ear pain. No ear drainage. She has hx of ear surgery (ear tubes) when she was a child, no other ear surgeries since then.   She has chronic nasal congestion and recurrent sinus infections, she is on Claritin and Flonase. She gets frontal and facial pain and pressure and has headaches. She grinds her teeth at night. Wears dental guard.   Records Reviewed:  Office note by PCP Dr Wyline Mood 12/07/22 Mouth ulcers Pt reports she had blisters in her mouth. She was given a mouthwash to use. She reports this didn't work. It started getting worse. She was given steroids at an urgent care and told to follow up with a dentist. She had to have her upper teeth removed.    Neck pain Pt has chronic neck pain. She was using Flexeril 10mg  BID prn and Ibuprofen 800mg  TID prn. She also has headaches and was using Elail 25mg  2 tabs po at bedtime prn. She has had her eye  Hx of occipital neuralgia and chronic neck pain.   Protruded cervical disc -     Amitriptyline HCl; Take 2 tablets (50 mg total) by mouth at bedtime as needed.  Dispense: 60 tablet; Refill: 1 -     Cyclobenzaprine HCl; Take 1 tablet (10 mg total) by mouth 2 (two) times daily as needed.  Dispense: 60 tablet; Refill: 1 -     Ibuprofen; Take 1 tablet (800 mg total) by mouth every 8 (eight) hours as needed.  Dispense: 90 tablet; Refill: 1   Chronic nonintractable headache, unspecified headache type -     Amitriptyline HCl; Take 2 tablets (50 mg total) by mouth at bedtime as needed.  Dispense: 60 tablet; Refill: 1 -     Ibuprofen; Take 1 tablet (800 mg  total) by mouth every 8 (eight) hours as needed.  Dispense: 90 tablet; Refill: 1       Past Medical History:  Diagnosis Date   History of drug use    Mental disorder    Pulmonary embolus in pregnancy childbirth or puerperium     Past Surgical History:  Procedure Laterality Date   APPLICATION OF ROBOTIC ASSISTANCE FOR SPINAL PROCEDURE N/A 03/07/2019   Procedure: APPLICATION OF ROBOTIC ASSISTANCE FOR SPINAL PROCEDURE;  Surgeon: Jadene Pierini, MD;  Location: [facility] OR;  Service: Neurosurgery;  Laterality: N/A;   BACK SURGERY Left 11/28/2008   L5-S1 diskectomy   TRANSFORAMINAL LUMBAR INTERBODY FUSION W/ MIS 1 LEVEL Left 03/07/2019   Procedure: Left Lumbar four-five minimally invasive transforaminal lumbar interbody fusion with Lumbar four-five Bilateral pedicle screw placement;  Surgeon: Jadene Pierini, MD;  Location: [facility] OR;  Service: Neurosurgery;  Laterality: Left;    History reviewed. No pertinent family history.  Social History:  reports that she has been smoking cigarettes. She has never used smokeless tobacco. She reports current drug use. Drugs: Cocaine, Marijuana, and Benzodiazepines. She reports that she does not drink alcohol.  Allergies:  Allergies  Allergen Reactions   Bee Venom Anaphylaxis and Hives    Medications: I have reviewed the patient's current medications.  The PMH, PSH, Medications,  Allergies, and SH were reviewed and updated.  ROS: Constitutional: Negative for fever, weight loss and weight gain. Cardiovascular: Negative for chest pain and dyspnea on exertion. Respiratory: Is not experiencing shortness of breath at rest. Gastrointestinal: Negative for nausea and vomiting. Neurological: Negative for headaches. Psychiatric: The patient is not nervous/anxious  Blood pressure (!) 135/96, pulse (!) 119, height 5\' 7"  (1.702 m), weight 237 lb (107.5 kg), SpO2 99%, unknown if currently breastfeeding.  PHYSICAL EXAM:  Exam: Respiratory Respiratory  effort: Equal inspiration and expiration without stridor Cardiovascular Peripheral Vascular: Warm extremities with equal color/perfusion Eyes: No nystagmus with equal extraocular motion bilaterally Neuro/Psych/Balance: Patient oriented to person, place, and time; Appropriate mood and affect; Gait is intact with no imbalance; Cranial nerves I-XII are intact Head and Face Inspection: Normocephalic and atraumatic without mass or lesion Palpation: Facial skeleton intact without bony stepoffs Salivary Glands: No mass or tenderness Facial Strength: Facial motility symmetric and full bilaterally ENT Pinna: External ear intact and fully developed External canal: Canal is patent with intact skin Tympanic Membrane: Clear and mobile External Nose: No scar or anatomic deformity Internal Nose: Septum intact on anterior rhinoscopy. No edema, polyp, or rhinorrhea Lips, Teeth, and gums: Mucosa and teeth intact and viable TMJ: (+) pain to palpation left TMJ with full mobility Oral cavity/oropharynx: No erythema or exudate, no lesions present Neck Neck and Trachea: Midline trachea without mass or lesion Thyroid: No mass or nodularity Lymphatics: No lymphadenopathy  Procedure: None  Studies Reviewed: done to rule out vertebral artery dissection  MR Angio of the head and neck - unremarkable   Assessment/Plan: Encounter Diagnoses  Name Primary?   Chronic sinusitis, unspecified location    Left ear pain Yes   Arthralgia of left temporomandibular joint    Chronic nasal congestion    Nasal obstruction     Chronic left ear pain for over a year - normal ear exam with tenderness at L TMJ and hx of bruxism  - suspect TMJ as the main cause of sx - detailed instructions given about TMJ sy and management of TMJ sy  2. Chronic nasal congestion and environmental allergies with recurrent sinus infections, no prior imaging of the sinuses, no allergy testing or sinus/nasal surgery - CT sinuses to rule out  chronic sinus disease  - switch to Claritin 10 mg daily and continue Flonase 2 puffs b/l nares BID - will consider allergy testing in the future  RTC after testing    Thank you for allowing me to participate in the care of this patient. Please do not hesitate to contact me with any questions or concerns.   Ashok Croon, MD Otolaryngology [facility] Phone: [phone] Fax: [phone]    04/05/2023, 6:59 PM

## 2023-04-05 NOTE — Telephone Encounter (Signed)
Prescription Request  04/05/2023  LOV: 01/18/2023  What is the name of the medication or equipment? Ibuprofen needs refills. Pt states she only has 15 muscle relaxers and is supposed to take them twice daily. She states she needs more of them, as 15 pills is not a 30 day supply.   Have you contacted your pharmacy to request a refill? Yes   Which pharmacy would you like this sent to?  [facility], [place] - [address] [place] [address] Phone: [phone] Fax: [phone]    Patient notified that their request is being sent to the clinical staff for review and that they should receive a response within 2 business days.   Please advise at Mobile [phone] (mobile)

## 2023-04-05 NOTE — Telephone Encounter (Signed)
Refilled the Flexeril and Ibuprofen.

## 2023-04-05 NOTE — Patient Instructions (Addendum)
-   continue Claritin and Flonase  - review instructions about TMJ and treat with Motrin and soft diet/warm compresses  - schedule CT sinuses   TMJ (Temporomandibular Joint Syndrome) The temporomandibular (tem-puh-roe-man-DIB-u-lur) joint (TMJ) acts like a sliding hinge, connecting your jawbone to your skull. You have one joint on each side of your jaw. TMJ disorders -- a type of temporomandibular disorder or TMD -- can cause pain in your jaw joint and in the muscles that control jaw movement.  The exact cause of a person's TMJ disorder is often difficult to determine. Your pain may be due to a combination of factors, such as genetics, arthritis or jaw injury. Some people who have jaw pain also tend to clench or grind their teeth (bruxism), although many people habitually clench or grind their teeth and never develop TMJ disorders.  In most cases, the pain and discomfort associated with TMJ disorders is temporary and can be relieved with self-managed care or nonsurgical treatments. This includes stress reduction, softer diet when the pain is present, anti-inflammatory pain medications such as Motrin and warm compresses.

## 2023-05-04 ENCOUNTER — Other Ambulatory Visit: Payer: Self-pay | Admitting: Family Medicine

## 2023-05-04 DIAGNOSIS — M5020 Other cervical disc displacement, unspecified cervical region: Secondary | ICD-10-CM

## 2023-05-04 DIAGNOSIS — R519 Headache, unspecified: Secondary | ICD-10-CM

## 2023-05-24 ENCOUNTER — Encounter: Payer: Self-pay | Admitting: Family Medicine

## 2023-05-24 ENCOUNTER — Ambulatory Visit (INDEPENDENT_AMBULATORY_CARE_PROVIDER_SITE_OTHER): Payer: Medicaid Other | Admitting: Family Medicine

## 2023-05-24 VITALS — BP 158/90 | HR 90 | Temp 98.3°F | Resp 18 | Ht 67.0 in | Wt 237.8 lb

## 2023-05-24 DIAGNOSIS — N814 Uterovaginal prolapse, unspecified: Secondary | ICD-10-CM

## 2023-05-24 DIAGNOSIS — M5020 Other cervical disc displacement, unspecified cervical region: Secondary | ICD-10-CM

## 2023-05-24 DIAGNOSIS — Z23 Encounter for immunization: Secondary | ICD-10-CM | POA: Diagnosis not present

## 2023-05-24 DIAGNOSIS — L709 Acne, unspecified: Secondary | ICD-10-CM

## 2023-05-24 DIAGNOSIS — R519 Headache, unspecified: Secondary | ICD-10-CM

## 2023-05-24 DIAGNOSIS — L602 Onychogryphosis: Secondary | ICD-10-CM | POA: Diagnosis not present

## 2023-05-24 DIAGNOSIS — G8929 Other chronic pain: Secondary | ICD-10-CM

## 2023-05-24 DIAGNOSIS — L680 Hirsutism: Secondary | ICD-10-CM

## 2023-05-24 DIAGNOSIS — Z1211 Encounter for screening for malignant neoplasm of colon: Secondary | ICD-10-CM

## 2023-05-24 MED ORDER — AMITRIPTYLINE HCL 50 MG PO TABS: 50.0000 mg | ORAL_TABLET | Freq: Every evening | ORAL | 5 refills | Status: DC | PRN

## 2023-05-24 NOTE — Progress Notes (Signed)
Established Patient Office Visit  Subjective   Patient ID: Alicia Hardy, female    DOB: 05/29/1976  Age: 47 y.o. MRN: [id number]  Chief Complaint  Patient presents with   Medical Management of Chronic Issues    Patient is here for a 4 month follow up    HPI  Pt is here for 4 month follow up. She reports she's been dealing with uterine prolapse for a few months. She reports when wiping, she felt a bulge that was worsening. She saw her OB/Gyn in [place] and was diagnosed with prolapse confirmed with ultrasound. She is awaiting surgery date for prolapse. She had to postpone her colonoscopy due to this upcoming surgery. She would like to do cologuard instead.  She would like dermatologist due to her facial acne and hair growth.  She also has thick toenails difficult to cut. She would like referral to podiatry for nail trimming.  Pt declines flu vaccine but would like Covid vaccine  Pt needs refills on her Elavil 25 mg 2 tabs at night prn that she uses for migraines and stable cervical DDD. She reports this improves her symptoms.   Review of Systems  Skin:        Thickened toenails Acne and facial hair growth  All other systems reviewed and are negative.     Objective:     BP (!) 158/90   Pulse 90   Temp 98.3 F (36.8 C) (Oral)   Resp 18   Ht 5\' 7"  (1.702 m)   Wt 237 lb 12.8 oz (107.9 kg)   SpO2 98%   BMI 37.24 kg/m  BP Readings from Last 3 Encounters:  05/24/23 (!) 158/90  04/05/23 (!) 135/96  01/18/23 137/84      Physical Exam Vitals and nursing note reviewed.  Constitutional:      Appearance: Normal appearance. She is normal weight.  HENT:     Head: Normocephalic and atraumatic.     Right Ear: External ear normal.     Left Ear: External ear normal.     Nose: Nose normal.     Mouth/Throat:     Mouth: Mucous membranes are moist.     Pharynx: Oropharynx is clear.  Eyes:     Conjunctiva/sclera: Conjunctivae normal.     Pupils: Pupils are equal, round, and  reactive to light.  Cardiovascular:     Rate and Rhythm: Normal rate.  Pulmonary:     Effort: Pulmonary effort is normal.  Skin:    General: Skin is warm.     Capillary Refill: Capillary refill takes less than 2 seconds.     Comments: Hypertrophied toenails bilaterally Hirsutism   Neurological:     General: No focal deficit present.     Mental Status: She is alert and oriented to person, place, and time. Mental status is at baseline.  Psychiatric:        Mood and Affect: Mood normal.        Behavior: Behavior normal.        Thought Content: Thought content normal.        Judgment: Judgment normal.    No results found for any visits on 05/24/23.  Last CBC Lab Results  Component Value Date   WBC 8.6 01/18/2023   HGB 13.2 01/18/2023   HCT 38.8 01/18/2023   MCV 93 01/18/2023   MCH 31.6 01/18/2023   RDW 11.8 01/18/2023   PLT 350 01/18/2023   Last metabolic panel Lab Results  Component Value  Date   GLUCOSE 86 01/18/2023   NA 139 01/18/2023   K 4.1 01/18/2023   CL 102 01/18/2023   CO2 21 01/18/2023   BUN 10 01/18/2023   CREATININE 0.65 01/18/2023   EGFR 109 01/18/2023   CALCIUM 9.1 01/18/2023   PROT 6.8 01/18/2023   ALBUMIN 4.1 01/18/2023   LABGLOB 2.7 01/18/2023   BILITOT 0.3 01/18/2023   ALKPHOS 101 01/18/2023   AST 13 01/18/2023   ALT 14 01/18/2023   ANIONGAP 11 03/07/2019   Last lipids Lab Results  Component Value Date   CHOL 149 01/18/2023   HDL 49 01/18/2023   LDLCALC 87 01/18/2023   TRIG 66 01/18/2023   CHOLHDL 3.0 01/18/2023   Last hemoglobin A1c Lab Results  Component Value Date   HGBA1C 5.4 01/18/2023      The 10-year ASCVD risk score (Arnett DK, et al., 2019) is: 6.2%    Assessment & Plan:   Problem List Items Addressed This Visit   None Uterine prolapse  Need for COVID-19 vaccine -     PFIZER Comirnaty(GRAY TOP)COVID-19 Vaccine  Screening for colon cancer -     Cologuard  Hypertrophy of nail -     Ambulatory referral to  Podiatry  Acne, unspecified acne type -     Ambulatory referral to Dermatology  Hirsutism -     Ambulatory referral to Dermatology  Protruded cervical disc -     Amitriptyline HCl; Take 1 tablet (50 mg total) by mouth at bedtime as needed for sleep.  Dispense: 30 tablet; Refill: 5  Chronic nonintractable headache, unspecified headache type -     Amitriptyline HCl; Take 1 tablet (50 mg total) by mouth at bedtime as needed for sleep.  Dispense: 30 tablet; Refill: 5   Continue routine follow up with OB for uterine prolapse. Covid vaccine booster today Send cologuard since pt had to postpone colonoscopy Refer to podiatry and dermatology as requested for hypertrophied toenails and acne/hirsutism respectively. Refilled Elavil 50mg  (since pt was taking 2 of the 25mg ) at night for chronic migraines and cervical DDD.   No follow-ups on file.    Suzan Slick, MD

## 2023-06-03 ENCOUNTER — Ambulatory Visit (INDEPENDENT_AMBULATORY_CARE_PROVIDER_SITE_OTHER): Payer: Medicaid Other | Admitting: Podiatry

## 2023-06-03 ENCOUNTER — Encounter: Payer: Self-pay | Admitting: Podiatry

## 2023-06-03 DIAGNOSIS — B351 Tinea unguium: Secondary | ICD-10-CM

## 2023-06-03 NOTE — Progress Notes (Signed)
  Subjective:  Patient ID: Alicia Hardy, female    DOB: 05/18/1976,   MRN: [id number]  No chief complaint on file.   47 y.o. female presents for concern of discolored and thickened toenails that are difficult for her to trim. Has been dealing with them for a while and denies any current treatments. Relates they have started to become painful.  . Denies any other pedal complaints. Denies n/v/f/c.   Past Medical History:  Diagnosis Date   History of drug use    Mental disorder    Pulmonary embolus in pregnancy childbirth or puerperium     Objective:  Physical Exam: Vascular: DP/PT pulses 2/4 bilateral. CFT <3 seconds. Normal hair growth on digits. No edema.  Skin. No lacerations or abrasions bilateral feet. Left hallux nail thickened and dystrophic and right second digit nail as well. With subungual debris.  Musculoskeletal: MMT 5/5 bilateral lower extremities in DF, PF, Inversion and Eversion. Deceased ROM in DF of ankle joint.  Neurological: Sensation intact to light touch.   Assessment:   1. Onychomycosis      Plan:  Patient was evaluated and treated and all questions answered. -Examined patient -Discussed treatment options for painful dystrophic nails  -Clinical picture and Fungal culture was obtained by removing a portion of the hard nail itself from each of the involved toenails using a sterile nail nipper and sent to [facility] lab. Patient tolerated the biopsy procedure well without discomfort or need for anesthesia.  -Discussed fungal nail treatment options including oral, topical, and laser treatments.  -Patient to return in 4 weeks for follow up evaluation and discussion of fungal culture results or sooner if symptoms worsen.   Louann Sjogren, DPM

## 2023-06-07 NOTE — Addendum Note (Signed)
Addended by: Daryel November on: 06/07/2023 04:33 PM   Modules accepted: Orders

## 2023-06-18 ENCOUNTER — Other Ambulatory Visit: Payer: Self-pay | Admitting: Podiatry

## 2023-07-08 ENCOUNTER — Ambulatory Visit: Payer: Medicaid Other | Admitting: Podiatry

## 2023-07-23 ENCOUNTER — Encounter: Payer: Self-pay | Admitting: Podiatry

## 2023-07-23 ENCOUNTER — Ambulatory Visit (INDEPENDENT_AMBULATORY_CARE_PROVIDER_SITE_OTHER): Payer: Medicaid Other | Admitting: Podiatry

## 2023-07-23 DIAGNOSIS — B351 Tinea unguium: Secondary | ICD-10-CM

## 2023-07-23 MED ORDER — CICLOPIROX 8 % EX SOLN: Freq: Every day | CUTANEOUS | 0 refills | Status: DC

## 2023-07-23 NOTE — Progress Notes (Signed)
  Subjective:  Patient ID: Alicia Hardy, female    DOB: 1976-04-21,   MRN: [id number]  No chief complaint on file.   48 y.o. female presents for follow-up of fungal nails and to go over culture results.   . Denies any other pedal complaints. Denies n/v/f/c.   Past Medical History:  Diagnosis Date   History of drug use    Mental disorder    Pulmonary embolus in pregnancy childbirth or puerperium     Objective:  Physical Exam: Vascular: DP/PT pulses 2/4 bilateral. CFT <3 seconds. Normal hair growth on digits. No edema.  Skin. No lacerations or abrasions bilateral feet. Left hallux nail thickened and dystrophic and right second digit nail as well. With subungual debris.  Musculoskeletal: MMT 5/5 bilateral lower extremities in DF, PF, Inversion and Eversion. Deceased ROM in DF of ankle joint.  Neurological: Sensation intact to light touch.   Assessment:   1. Onychomycosis       Plan:  Patient was evaluated and treated and all questions answered. -Examined patient -Discussed treatment options for painful dystrophic nails  -Culture positive for fungus (t rubrum)  -Discussed fungal nail treatment options including oral, topical, and laser treatments.  -Patient elects to go with penlac. Prescription provided.  -Patient to return in 3 months for recheck.    Louann Sjogren, DPM

## 2023-07-29 LAB — COLOGUARD: COLOGUARD: NEGATIVE

## 2023-07-30 ENCOUNTER — Ambulatory Visit: Payer: Medicaid Other | Admitting: Family Medicine

## 2023-07-30 ENCOUNTER — Encounter: Payer: Self-pay | Admitting: Family Medicine

## 2023-08-02 ENCOUNTER — Ambulatory Visit: Payer: Medicaid Other | Admitting: Family Medicine

## 2023-08-02 ENCOUNTER — Encounter: Payer: Self-pay | Admitting: Family Medicine

## 2023-08-02 VITALS — BP 139/86 | HR 82 | Temp 97.5°F | Resp 18 | Ht 67.0 in | Wt 231.7 lb

## 2023-08-02 DIAGNOSIS — I10 Essential (primary) hypertension: Secondary | ICD-10-CM | POA: Diagnosis not present

## 2023-08-02 DIAGNOSIS — R6884 Jaw pain: Secondary | ICD-10-CM

## 2023-08-02 DIAGNOSIS — F39 Unspecified mood [affective] disorder: Secondary | ICD-10-CM | POA: Diagnosis not present

## 2023-08-02 MED ORDER — AMLODIPINE BESYLATE 5 MG PO TABS: 5.0000 mg | ORAL_TABLET | Freq: Every day | ORAL | 0 refills | Status: DC

## 2023-08-02 MED ORDER — BLOOD PRESSURE KIT: PACK | 0 refills | Status: DC

## 2023-08-02 NOTE — Progress Notes (Signed)
 Established Patient Office Visit  Subjective   Patient ID: Alicia Hardy, female    DOB: 03-Sep-1975  Age: 48 y.o. MRN: [id number]  Chief Complaint  Patient presents with   Hypertension    Patient states that every since she had her hysterectomy that her BP has been going up. She states that she has checked every since the surgery and her readings have been very high. She would like to know if she could start on Lorazopam 0.5 That was prescribed to her by the doctor at the hospital before her surgery    Hypertension  Hypertension Pt reports she's working on diet since her mother was diagnosed with diabetes. She has been checking her blood pressure and it's been high outside the office. She is checking at pharmacies and it's been above 160 sbp. She denies chest pains or SOB. She does have jaw pain that she's seeing ENT for. Awaiting CT scan.   She also has mood disorder and anxiety. Her OB gave her Ativan before the surgery. She asks for this to be refilled. She has been referred to [facility] but has to cancel the appointment. She has hx of drug abuse.   Review of Systems  HENT:         Jaw pain  All other systems reviewed and are negative.    Objective:     BP 139/86   Pulse 82   Temp (!) 97.5 F (36.4 C) (Oral)   Resp 18   Ht 5\' 7"  (1.702 m)   Wt 231 lb 11.2 oz (105.1 kg)   SpO2 100%   BMI 36.29 kg/m    Physical Exam Vitals and nursing note reviewed.  Constitutional:      Appearance: Normal appearance. She is normal weight.  HENT:     Head: Normocephalic and atraumatic.     Right Ear: External ear normal.     Left Ear: External ear normal.     Nose: Nose normal.     Mouth/Throat:     Mouth: Mucous membranes are moist.     Pharynx: Oropharynx is clear.  Eyes:     Conjunctiva/sclera: Conjunctivae normal.     Pupils: Pupils are equal, round, and reactive to light.  Cardiovascular:     Rate and Rhythm: Normal rate.  Pulmonary:     Effort: Pulmonary effort is normal.   Skin:    General: Skin is warm.     Capillary Refill: Capillary refill takes less than 2 seconds.  Neurological:     General: No focal deficit present.     Mental Status: She is alert and oriented to person, place, and time. Mental status is at baseline.  Psychiatric:        Mood and Affect: Mood normal.        Behavior: Behavior normal.        Thought Content: Thought content normal.        Judgment: Judgment normal.    No results found for any visits on 08/02/23.    The 10-year ASCVD risk score (Arnett DK, et al., 2019) is: 3.6%    Assessment & Plan:   Problem List Items Addressed This Visit   None Primary hypertension -     amLODIPine  Besylate; Take 1 tablet (5 mg total) by mouth daily.  Dispense: 90 tablet; Refill: 0 -     Blood Pressure; Check blood pressure daily  Dispense: 1 kit; Refill: 0  Mood disorder (HCC) -     Ambulatory  referral to Behavioral Health  Jaw pain   To start on antihypertensive Amlodipine  5 mg daily. Monitor blood pressures outside the office. See back in 4 weeks. Refer to [facility] for management and treatment options of mood disorder Advised pt to follow up with ENT for ongoing jaw pain.  No follow-ups on file.    Manette Section, MD

## 2023-08-04 ENCOUNTER — Ambulatory Visit (INDEPENDENT_AMBULATORY_CARE_PROVIDER_SITE_OTHER): Payer: Medicaid Other | Admitting: Family Medicine

## 2023-08-04 ENCOUNTER — Encounter: Payer: Self-pay | Admitting: Family Medicine

## 2023-08-04 VITALS — BP 178/112 | HR 74 | Temp 97.6°F | Resp 18 | Ht 67.0 in | Wt 230.6 lb

## 2023-08-04 DIAGNOSIS — M5412 Radiculopathy, cervical region: Secondary | ICD-10-CM

## 2023-08-04 MED ORDER — TRAMADOL HCL 50 MG PO TABS: 50.0000 mg | ORAL_TABLET | Freq: Three times a day (TID) | ORAL | 0 refills | Status: AC | PRN

## 2023-08-04 MED ORDER — LIDOCAINE 5 % EX PTCH
1.0000 | MEDICATED_PATCH | CUTANEOUS | 0 refills | Status: AC
Start: 2023-08-04 — End: 2023-08-13

## 2023-08-04 MED ORDER — METHYLPREDNISOLONE 4 MG PO TBPK: ORAL_TABLET | ORAL | 0 refills | Status: DC

## 2023-08-04 MED ORDER — KETOROLAC TROMETHAMINE 30 MG/ML IJ SOLN
30.0000 mg | Freq: Once | INTRAMUSCULAR | Status: AC
  Administered 2023-08-04: 30 mg via INTRAMUSCULAR

## 2023-08-04 NOTE — Progress Notes (Signed)
Acute Office Visit  Subjective:     Patient ID: Alicia Hardy, female    DOB: 12/18/1975, 48 y.o.   MRN: [id number]  Chief Complaint  Patient presents with   Follow-up    Patient is here for a follow up from the ER from Monday. Patient states that she needs a referral to ortho    HPI Patient is in today for acute visit.  Pt reports when getting out of the car on Monday, she started having pain in her neck and jaw. She reports the pain caused her to drop to her knees. She has hx of cervical radiculopathy but reports the left side of her jaw has been in pain. She went to ER and had CT scan of cervical spine which showed very small C3-4 disc protrusions. Pt was seen for left sided shoulder and neck pain back in 2021 while in [place] and sent for MRI of cervical spine. At that time, the MRI revealed disc protrusion. She was referred to Neurosurgery and reports they told her nothing could be done at that time. Pt also had lumbar radicular symptoms and ended up having surgery in 2020. Pt completed PT for her cervical neck pain.   Review of Systems  HENT:         Left jaw pain  Musculoskeletal:  Positive for neck pain.  All other systems reviewed and are negative.      Objective:    There were no vitals taken for this visit. BP Readings from Last 3 Encounters:  08/04/23 (!) 178/112  08/02/23 139/86  05/24/23 (!) 158/90      Physical Exam Vitals and nursing note reviewed.  Constitutional:      Appearance: Normal appearance. She is normal weight.  HENT:     Head: Normocephalic and atraumatic.     Right Ear: Tympanic membrane, ear canal and external ear normal.     Left Ear: Tympanic membrane, ear canal and external ear normal.     Nose: Nose normal.     Mouth/Throat:     Mouth: Mucous membranes are moist.     Pharynx: Oropharynx is clear.  Eyes:     Conjunctiva/sclera: Conjunctivae normal.     Pupils: Pupils are equal, round, and reactive to light.  Neck:     Comments:  Diminished ROM of cervical spine in all directions. Positive Spurling's test Cardiovascular:     Rate and Rhythm: Normal rate.  Pulmonary:     Effort: Pulmonary effort is normal.  Musculoskeletal:     Cervical back: Tenderness present.  Skin:    General: Skin is warm.     Capillary Refill: Capillary refill takes less than 2 seconds.  Neurological:     General: No focal deficit present.     Mental Status: She is alert and oriented to person, place, and time. Mental status is at baseline.  Psychiatric:        Behavior: Behavior normal.        Thought Content: Thought content normal.        Judgment: Judgment normal.     Comments: Tearful and in pain   No results found for any visits on 08/04/23.      Assessment & Plan:   Problem List Items Addressed This Visit   None Cervical radiculopathy -     Ketorolac Tromethamine -     methylPREDNISolone; 6-day pack as directed  Dispense: 21 tablet; Refill: 0 -     traMADol HCl; Take 1  tablet (50 mg total) by mouth every 8 (eight) hours as needed for up to 5 days.  Dispense: 15 tablet; Refill: 0 -     Lidocaine; Place 1 patch onto the skin daily for 9 days.  Dispense: 9 patch; Refill: 0 -     Ambulatory referral to Neurosurgery   Pt with worsening and exacerbated cervical radicular pain from 2021. To refer back to NSG. For now, given Toradol 30mg  IM today, steroid dose pack along with Lidocaine pain patch and Ultram 50mg  1 tab po TID prn for 5 days.    No orders of the defined types were placed in this encounter.   No follow-ups on file.  Suzan Slick, MD

## 2023-08-12 ENCOUNTER — Other Ambulatory Visit: Payer: Self-pay | Admitting: Family Medicine

## 2023-08-12 DIAGNOSIS — M5412 Radiculopathy, cervical region: Secondary | ICD-10-CM

## 2023-08-12 MED ORDER — BACLOFEN 10 MG PO TABS: 10.0000 mg | ORAL_TABLET | Freq: Three times a day (TID) | ORAL | 1 refills | Status: DC

## 2023-08-12 MED ORDER — TRAMADOL HCL 50 MG PO TABS: 50.0000 mg | ORAL_TABLET | Freq: Three times a day (TID) | ORAL | 0 refills | Status: AC | PRN

## 2023-08-12 NOTE — Telephone Encounter (Signed)
 Copied from CRM [id number]. Topic: Clinical - Medication Refill >> Aug 12, 2023  8:15 AM Geroge Baseman wrote: Most Recent Primary Care Visit:  Provider: Suzan Slick  Department: [facility]  Visit Type: OFFICE VISIT  Date: 08/04/2023  Medication: methylPREDNISolone (MEDROL DOSEPAK) 4 MG TBPK tablet  Has the patient contacted their pharmacy? No (Agent: If no, request that the patient contact the pharmacy for the refill. If patient does not wish to contact the pharmacy document the reason why and proceed with request.) (Agent: If yes, when and what did the pharmacy advise?)  Is this the correct pharmacy for this prescription? Yes If no, delete pharmacy and type the correct one.  This is the patient's preferred pharmacy:  [facility] [place] - [address] [place] [address] Phone: [phone] Fax: [phone]   Has the prescription been filled recently? No  Is the patient out of the medication? Yes  Has the patient been seen for an appointment in the last year OR does the patient have an upcoming appointment? Yes  Can we respond through MyChart? Yes  Agent: Please be advised that Rx refills may take up to 3 business days. We ask that you follow-up with your pharmacy.

## 2023-08-12 NOTE — Telephone Encounter (Signed)
 Copied from CRM [id number]. Topic: Clinical - Medication Refill >> Aug 12, 2023  8:18 AM Geroge Baseman wrote: Most Recent Primary Care Visit:  Provider: Suzan Slick  Department: [facility]  Visit Type: OFFICE VISIT  Date: 08/04/2023  Medication: baclofen (LIORESAL) 10 MG tablet   Has the patient contacted their pharmacy? No (Agent: If no, request that the patient contact the pharmacy for the refill. If patient does not wish to contact the pharmacy document the reason why and proceed with request.) (Agent: If yes, when and what did the pharmacy advise?)  Is this the correct pharmacy for this prescription? No If no, delete pharmacy and type the correct one.  This is the patient's preferred pharmacy:  [facility], [place] - [address] [place] [address] Phone: [phone] Fax: [phone]   Has the prescription been filled recently? No  Is the patient out of the medication? Yes  Has the patient been seen for an appointment in the last year OR does the patient have an upcoming appointment? Yes  Can we respond through MyChart? Yes  Agent: Please be advised that Rx refills may take up to 3 business days. We ask that you follow-up with your pharmacy.

## 2023-08-12 NOTE — Telephone Encounter (Signed)
 Please inform pt I sent her in pain medicine Ultram and [facility]en [facility] [place].

## 2023-08-12 NOTE — Telephone Encounter (Signed)
 Copied from CRM [id number]. Topic: Clinical - Medication Refill >> Aug 12, 2023  8:15 AM Geroge Baseman wrote: Most Recent Primary Care Visit:  Provider: Suzan Slick  Department: [facility]  Visit Type: OFFICE VISIT  Date: 08/04/2023  Medication: methylPREDNISolone (MEDROL DOSEPAK) 4 MG TBPK tablet, baclofen (LIORESAL) 10 MG tablet   Has the patient contacted their pharmacy? No (Agent: If no, request that the patient contact the pharmacy for the refill. If patient does not wish to contact the pharmacy document the reason why and proceed with request.) (Agent: If yes, when and what did the pharmacy advise?)  Is this the correct pharmacy for this prescription? Yes If no, delete pharmacy and type the correct one.  This is the patient's preferred pharmacy:  [facility], [place] - [address] [place] [address] Phone: [phone] Fax: [phone]   Has the prescription been filled recently? No  Is the patient out of the medication? Yes  Has the patient been seen for an appointment in the last year OR does the patient have an upcoming appointment? Yes  Can we respond through MyChart? Yes  Agent: Please be advised that Rx refills may take up to 3 business days. We ask that you follow-up with your pharmacy.

## 2023-08-12 NOTE — Telephone Encounter (Signed)
  Call to patient- she was seen by neurology yesterday- more test have been ordered- but patient was advised he couldn't treat patient pain until he got results. Patient states she is in pain and would like some help with that. Patient states she has disk problem- but provider could not see results- therefore ordered his own testing. Dr Hoyt Koch, [address] [place]  Copied from CRM [id number]. Topic: Clinical - Medication Refill >> Aug 12, 2023  8:18 AM Geroge Baseman wrote: Most Recent Primary Care Visit:  Provider: Suzan Slick  Department: [facility]  Visit Type: OFFICE VISIT  Date: 08/04/2023  Medication: baclofen (LIORESAL) 10 MG tablet   Has the patient contacted their pharmacy? No (Agent: If no, request that the patient contact the pharmacy for the refill. If patient does not wish to contact the pharmacy document the reason why and proceed with request.) (Agent: If yes, when and what did the pharmacy advise?)  Is this the correct pharmacy for this prescription? No If no, delete pharmacy and type the correct one.  This is the patient's preferred pharmacy:  [facility], [place] - [address] [place] [address] Phone: [phone] Fax: [phone]   Has the prescription been filled recently? No  Is the patient out of the medication? Yes  Has the patient been seen for an appointment in the last year OR does the patient have an upcoming appointment? Yes  Can we respond through MyChart? Yes  Agent: Please be advised that Rx refills may take up to 3 business days. We ask that you follow-up with your pharmacy.

## 2023-08-25 ENCOUNTER — Ambulatory Visit: Payer: Self-pay | Admitting: Family Medicine

## 2023-08-25 NOTE — Telephone Encounter (Signed)
 Copied from CRM [id number]. Topic: Clinical - Red Word Triage >> Aug 25, 2023  8:04 AM Gildardo Pounds wrote: Red Word that prompted transfer to Nurse Triage: severe pain with bulging disk in neck, cannot eat or sleep. pain level 10. blood pressure 178/96. callback number is [phone]   Chief Complaint: Neck pain Symptoms: headache, neck pain, and shoulder pain Frequency: constant Pertinent Negatives: Patient denies chest pain or shortness of breath Disposition: [] ED /[x] Urgent Care (no appt availability in office) / [] Appointment(In office/virtual)/ []  [facility]/ [] Home Care/ [] Refused Recommended Disposition /[] [facility]/ []  Follow-up with PCP Additional Notes: Pt endorsing pain x 1 month, worsening last 3 days after doing some laundry. States that she has taken OTC medications with no relief. BP this morning is 178/96 and she has severe headache and eyes watery. No avail appts until this afternoon. RN advising UC or ED, pt is agreeable and will have her mother transport her.    Reason for Disposition  [1] SEVERE neck pain (e.g., excruciating, unable to do any normal activities) AND [2] not improved after 2 hours of pain medicine  Answer Assessment - Initial Assessment Questions 1. ONSET: "When did the pain begin?"      Last month, getting worse last 3 days  2. LOCATION: "Where does it hurt?"      Back of head, on neck, then left shoulders  3. PATTERN "Does the pain come and go, or has it been constant since it started?"      Constant  4. SEVERITY: "How bad is the pain?"  (Scale 1-10; or mild, moderate, severe)   - NO PAIN (0): no pain or only slight stiffness    - MILD (1-3): doesn't interfere with normal activities    - MODERATE (4-7): interferes with normal activities or awakens from sleep    - SEVERE (8-10):  excruciating pain, unable to do any normal activities      Severe pain  5. RADIATION: "Does the pain go anywhere else, shoot into your arms?"     None  radiating  6. CORD SYMPTOMS: "Any weakness or numbness of the arms or legs?"     No weakness  7. CAUSE: "What do you think is causing the neck pain?"     History of bulging disc   8. NECK OVERUSE: "Any recent activities that involved turning or twisting the neck?"     No, states that she was washing clothes and experienced some sharp pains  9. OTHER SYMPTOMS: "Do you have any other symptoms?" (e.g., headache, fever, chest pain, difficulty breathing, neck swelling)     Blood pressure elevated, headache  10. PREGNANCY: "Is there any chance you are pregnant?" "When was your last menstrual period?"       No, hysterectomy December 2024  Protocols used: Neck Pain or Stiffness-A-AH

## 2023-08-28 ENCOUNTER — Emergency Department (HOSPITAL_COMMUNITY)
Admission: EM | Admit: 2023-08-28 | Discharge: 2023-08-28 | Disposition: A | Discharge status: Home / Self Care | Attending: Emergency Medicine | Admitting: Emergency Medicine

## 2023-08-28 ENCOUNTER — Other Ambulatory Visit: Payer: Self-pay

## 2023-08-28 ENCOUNTER — Encounter (HOSPITAL_COMMUNITY): Payer: Self-pay | Admitting: *Deleted

## 2023-08-28 ENCOUNTER — Emergency Department (HOSPITAL_COMMUNITY)

## 2023-08-28 DIAGNOSIS — K529 Noninfective gastroenteritis and colitis, unspecified: Secondary | ICD-10-CM | POA: Insufficient documentation

## 2023-08-28 DIAGNOSIS — R1084 Generalized abdominal pain: Secondary | ICD-10-CM | POA: Diagnosis present

## 2023-08-28 DIAGNOSIS — R112 Nausea with vomiting, unspecified: Secondary | ICD-10-CM

## 2023-08-28 DIAGNOSIS — R300 Dysuria: Secondary | ICD-10-CM | POA: Diagnosis not present

## 2023-08-28 LAB — CBC
HCT: 41.2 % (ref 36.0–46.0)
Hemoglobin: 13.8 g/dL (ref 12.0–15.0)
MCH: 30.6 pg (ref 26.0–34.0)
MCHC: 33.5 g/dL (ref 30.0–36.0)
MCV: 91.4 fL (ref 80.0–100.0)
Platelets: 215 10*3/uL (ref 150–400)
RBC: 4.51 MIL/uL (ref 3.87–5.11)
RDW: 13 % (ref 11.5–15.5)
WBC: 9.9 10*3/uL (ref 4.0–10.5)
nRBC: 0 % (ref 0.0–0.2)

## 2023-08-28 LAB — TROPONIN I (HIGH SENSITIVITY): Troponin I (High Sensitivity): 2 ng/L (ref <18)

## 2023-08-28 LAB — COMPREHENSIVE METABOLIC PANEL
ALT: 51 U/L — ABNORMAL HIGH (ref 0–44)
AST: 37 U/L (ref 15–41)
Albumin: 3.7 g/dL (ref 3.5–5.0)
Alkaline Phosphatase: 92 U/L (ref 38–126)
Anion gap: 14 (ref 5–15)
BUN: 18 mg/dL (ref 6–20)
CO2: 20 mmol/L — ABNORMAL LOW (ref 22–32)
Calcium: 9.7 mg/dL (ref 8.9–10.3)
Chloride: 103 mmol/L (ref 98–111)
Creatinine, Ser: 0.9 mg/dL (ref 0.44–1.00)
GFR, Estimated: >60 mL/min (ref >60)
Glucose, Bld: 126 mg/dL — ABNORMAL HIGH (ref 70–99)
Potassium: 3.5 mmol/L (ref 3.5–5.1)
Sodium: 137 mmol/L (ref 135–145)
Total Bilirubin: 0.8 mg/dL (ref 0.0–1.2)
Total Protein: 7.1 g/dL (ref 6.5–8.1)

## 2023-08-28 LAB — RESP PANEL BY RT-PCR (RSV, FLU A&B, COVID)  RVPGX2
Influenza A by PCR: NEGATIVE
Influenza B by PCR: NEGATIVE
Resp Syncytial Virus by PCR: NEGATIVE
SARS Coronavirus 2 by RT PCR: NEGATIVE

## 2023-08-28 LAB — URINALYSIS, ROUTINE W REFLEX MICROSCOPIC
Bilirubin Urine: NEGATIVE
Glucose, UA: NEGATIVE mg/dL
Hgb urine dipstick: NEGATIVE
Ketones, ur: NEGATIVE mg/dL
Leukocytes,Ua: NEGATIVE
Nitrite: NEGATIVE
Protein, ur: 30 mg/dL — AB
Specific Gravity, Urine: 1.018 (ref 1.005–1.030)
pH: 8 (ref 5.0–8.0)

## 2023-08-28 LAB — LIPASE, BLOOD: Lipase: 22 U/L (ref 11–51)

## 2023-08-28 MED ORDER — DICYCLOMINE HCL 10 MG PO CAPS
10.0000 mg | ORAL_CAPSULE | Freq: Once | ORAL | Status: AC
  Administered 2023-08-28: 10 mg via ORAL
  Filled 2023-08-28: qty 1

## 2023-08-28 MED ORDER — IOHEXOL 300 MG/ML  SOLN
100.0000 mL | Freq: Once | INTRAMUSCULAR | Status: AC | PRN
  Administered 2023-08-28: 100 mL via INTRAVENOUS

## 2023-08-28 MED ORDER — PANTOPRAZOLE SODIUM 20 MG PO TBEC
40.0000 mg | DELAYED_RELEASE_TABLET | Freq: Every day | ORAL | 0 refills | Status: DC
Start: 2023-08-28 — End: 2023-09-06

## 2023-08-28 MED ORDER — LOPERAMIDE HCL 2 MG PO CAPS: 2.0000 mg | ORAL_CAPSULE | Freq: Four times a day (QID) | ORAL | 0 refills | Status: DC | PRN

## 2023-08-28 MED ORDER — ONDANSETRON HCL 4 MG/2ML IJ SOLN
4.0000 mg | Freq: Once | INTRAMUSCULAR | Status: AC
  Administered 2023-08-28: 4 mg via INTRAVENOUS
  Filled 2023-08-28: qty 2

## 2023-08-28 MED ORDER — SODIUM CHLORIDE 0.9 % IV BOLUS
1000.0000 mL | Freq: Once | INTRAVENOUS | Status: AC
  Administered 2023-08-28: 1000 mL via INTRAVENOUS

## 2023-08-28 MED ORDER — ONDANSETRON 4 MG PO TBDP: 4.0000 mg | ORAL_TABLET | Freq: Three times a day (TID) | ORAL | 0 refills | Status: DC | PRN

## 2023-08-28 MED ORDER — PANTOPRAZOLE SODIUM 40 MG IV SOLR
40.0000 mg | Freq: Once | INTRAVENOUS | Status: AC
Start: 2023-08-28 — End: 2023-08-28
  Administered 2023-08-28: 40 mg via INTRAVENOUS
  Filled 2023-08-28: qty 10

## 2023-08-28 MED ORDER — DICYCLOMINE HCL 20 MG PO TABS: 20.0000 mg | ORAL_TABLET | Freq: Two times a day (BID) | ORAL | 0 refills | Status: DC

## 2023-08-28 MED ORDER — KETOROLAC TROMETHAMINE 30 MG/ML IJ SOLN
30.0000 mg | Freq: Once | INTRAMUSCULAR | Status: AC
  Administered 2023-08-28: 30 mg via INTRAVENOUS
  Filled 2023-08-28: qty 1

## 2023-08-28 NOTE — ED Provider Notes (Signed)
 [place] EMERGENCY DEPARTMENT AT [facility] Provider Note   CSN: [id number] Arrival date & time: 08/28/23  0940     History  Chief Complaint  Patient presents with   Abdominal Pain    Alicia Hardy is a 48 y.o. female with history of chronic neck pain, who presents the emergency department complaining of abdominal pain, diarrhea, and dysuria starting last night.  Developed nausea and vomiting upon arrival to the ER.  Family at bedside states that patient has been in and out of the hospital for over a month with worsening pain in her neck, and they have not been given an answer as to why.  They report she has history of lupus, and she has a "80% blockage in her neck".  Patient had hysterectomy in December 2024, has not had any vaginal bleeding, but the pain she is having does radiate to her groin and up into her chest.  She tried taking 2 aspirin prior to arrival.  Family states that patient's stool has been foul-smelling and "foamy".   Abdominal Pain Associated symptoms: chest pain, diarrhea, dysuria, nausea and vomiting        Home Medications Prior to Admission medications   Medication Sig Start Date End Date Taking? Authorizing Provider  dicyclomine (BENTYL) 20 MG tablet Take 1 tablet (20 mg total) by mouth 2 (two) times daily. 08/28/23  Yes Briyonna Omara T, PA-C  loperamide (IMODIUM) 2 MG capsule Take 1 capsule (2 mg total) by mouth 4 (four) times daily as needed for diarrhea or loose stools. 08/28/23  Yes Jakai Risse T, PA-C  ondansetron (ZOFRAN-ODT) 4 MG disintegrating tablet Take 1 tablet (4 mg total) by mouth every 8 (eight) hours as needed for nausea or vomiting. 08/28/23  Yes Joshuajames Moehring T, PA-C  pantoprazole (PROTONIX) 20 MG tablet Take 2 tablets (40 mg total) by mouth daily. 08/28/23 09/27/23 Yes Clayborne Divis T, PA-C  amitriptyline (ELAVIL) 50 MG tablet Take 1 tablet (50 mg total) by mouth at bedtime as needed for sleep. 05/24/23   Suzan Slick, MD   amLODipine (NORVASC) 5 MG tablet Take 1 tablet (5 mg total) by mouth daily. 08/02/23   Suzan Slick, MD  baclofen (LIORESAL) 10 MG tablet Take 1 tablet (10 mg total) by mouth 3 (three) times daily. 08/12/23   Suzan Slick, MD  Blood Pressure KIT Check blood pressure daily 08/02/23   Suzan Slick, MD  ciclopirox [facility]) 8 % solution Apply topically at bedtime. Apply over nail and surrounding skin. Apply daily over previous coat. After seven (7) days, may remove with alcohol and continue cycle. 07/23/23   Louann Sjogren, DPM  cyclobenzaprine (FLEXERIL) 10 MG tablet Take 1 tablet (10 mg total) by mouth 2 (two) times daily as needed. 04/05/23   Suzan Slick, MD  fluticasone (FLONASE) 50 MCG/ACT nasal spray Place 2 sprays into both nostrils daily. 04/05/23   Ashok Croon, MD  hydrOXYzine (ATARAX) 25 MG tablet Take 25 mg by mouth every 8 (eight) hours as needed. 03/03/22   [provider]  ibuprofen (ADVIL) 800 MG tablet TAKE ONE TABLET BY MOUTH EVERY EIGHT HOURS AS NEEDED 05/05/23   Suzan Slick, MD  loratadine (CLARITIN) 10 MG tablet Take 1 tablet (10 mg total) by mouth daily. 04/05/23   Ashok Croon, MD  LORazepam (ATIVAN) 0.5 MG tablet Take by mouth. 06/15/23   [provider]  methylPREDNISolone (MEDROL DOSEPAK) 4 MG TBPK tablet 6-day pack as directed 08/04/23  Suzan Slick, MD      Allergies    Bee venom    Review of Systems   Review of Systems  Cardiovascular:  Positive for chest pain.  Gastrointestinal:  Positive for abdominal pain, diarrhea, nausea and vomiting.  Genitourinary:  Positive for dysuria.  All other systems reviewed and are negative.   Physical Exam Updated Vital Signs BP (!) 148/90   Pulse (!) 109   Temp 97.7 F (36.5 C) (Oral)   Resp (!) 24   Ht 5\' 7"  (1.702 m)   Wt 99.3 kg   SpO2 99%   BMI 34.30 kg/m  Physical Exam Vitals and nursing note reviewed.  Constitutional:      Appearance: Normal appearance.   HENT:     Head: Normocephalic and atraumatic.  Eyes:     Conjunctiva/sclera: Conjunctivae normal.  Cardiovascular:     Rate and Rhythm: Normal rate and regular rhythm.  Pulmonary:     Effort: Pulmonary effort is normal. No respiratory distress.     Breath sounds: Normal breath sounds.  Abdominal:     General: There is no distension.     Palpations: Abdomen is soft.     Tenderness: There is abdominal tenderness in the suprapubic area.  Skin:    General: Skin is warm and dry.  Neurological:     General: No focal deficit present.     Mental Status: She is alert.     ED Results / Procedures / Treatments   Labs (all labs ordered are listed, but only abnormal results are displayed) Labs Reviewed  COMPREHENSIVE METABOLIC PANEL - Abnormal; Notable for the following components:      Result Value   CO2 20 (*)    Glucose, Bld 126 (*)    ALT 51 (*)    All other components within normal limits  URINALYSIS, ROUTINE W REFLEX MICROSCOPIC - Abnormal; Notable for the following components:   Color, Urine AMBER (*)    APPearance CLOUDY (*)    Protein, ur 30 (*)    Bacteria, UA RARE (*)    All other components within normal limits  RESP PANEL BY RT-PCR (RSV, FLU A&B, COVID)  RVPGX2  LIPASE, BLOOD  CBC  TROPONIN I (HIGH SENSITIVITY)    EKG EKG Interpretation Date/Time:  Saturday August 28 2023 11:42:22 EST Ventricular Rate:  91 PR Interval:  122 QRS Duration:  83 QT Interval:  340 QTC Calculation: 419 R Axis:   81  Text Interpretation: Sinus rhythm Borderline repolarization abnormality nonspecific STs new from prior 9/12 Confirmed by Meridee Score [phone]) on 08/28/2023 12:14:01 PM  Radiology CT ABDOMEN PELVIS W CONTRAST Result Date: 08/28/2023 CLINICAL DATA:  Acute lower abdominal pain and diarrhea. EXAM: CT ABDOMEN AND PELVIS WITH CONTRAST TECHNIQUE: Multidetector CT imaging of the abdomen and pelvis was performed using the standard protocol following bolus administration of  intravenous contrast. RADIATION DOSE REDUCTION: This exam was performed according to the departmental dose-optimization program which includes automated exposure control, adjustment of the mA and/or kV according to patient size and/or use of iterative reconstruction technique. CONTRAST:  OMNIPAQUE IOHEXOL 300 MG/ML  SOLN COMPARISON:  None Available. FINDINGS: Lower Chest: No acute findings. Hepatobiliary: A few tiny sub-cm low attention lesions in the left hepatic lobe are too small to characterize, but most likely represent tiny cysts. Gallbladder is unremarkable. No evidence of biliary ductal dilatation. Pancreas:  No mass or inflammatory changes. Spleen: Within normal limits in size and appearance. Adrenals/Urinary Tract: No suspicious masses  identified. A few tiny 1-2 mm renal calculi are seen. No evidence of ureteral calculi or hydronephrosis. Unremarkable unopacified urinary bladder. Stomach/Bowel: Mild diffuse colonic wall thickening is seen, consistent with colitis. Normal appendix visualized. No evidence of small bowel involvement. No evidence of perforation or abscess. Vascular/Lymphatic: No pathologically enlarged lymph nodes. No acute vascular findings. Reproductive: Prior hysterectomy noted. Adnexal regions are unremarkable in appearance. Other:  None. Musculoskeletal:  No suspicious bone lesions identified. IMPRESSION: Mild diffuse colitis. No evidence of perforation or abscess. Tiny bilateral renal calculi. No evidence of ureteral calculi or hydronephrosis. Electronically Signed   By: Danae Orleans M.D.   On: 08/28/2023 13:29    Procedures Procedures    Medications Ordered in ED Medications  sodium chloride 0.9 % bolus 1,000 mL (1,000 mLs Intravenous New Bag/Given 08/28/23 1128)  ondansetron (ZOFRAN) injection 4 mg (4 mg Intravenous Given 08/28/23 1128)  pantoprazole (PROTONIX) injection 40 mg (40 mg Intravenous Given 08/28/23 1127)  iohexol (OMNIPAQUE) 300 MG/ML solution 100 mL (100 mLs  Intravenous Contrast Given 08/28/23 1248)  ketorolac (TORADOL) 30 MG/ML injection 30 mg (30 mg Intravenous Given 08/28/23 1403)  dicyclomine (BENTYL) capsule 10 mg (10 mg Oral Given 08/28/23 1404)    ED Course/ Medical Decision Making/ A&P                                 Medical Decision Making Amount and/or Complexity of Data Reviewed Labs: ordered. Radiology: ordered.  Risk Prescription drug management.   This patient is a 48 y.o. female  who presents to the ED for concern of abdominal pain, diarrhea, dysuria.   Differential diagnoses prior to evaluation: The emergent differential diagnosis includes, but is not limited to,  PID, appendicitis, kidney stone, septic abortion, ruptured ovarian cyst, ovarian torsion, tubo-ovarian abscess, fibroids, endometriosis, diverticulitis, cystitis. This is not an exhaustive differential.   Past Medical History / Co-morbidities / Social History: chronic neck pain  Additional history: Chart reviewed. Pertinent results include: hx vaginal hysterectomy and right ovary removal Dec 2024; most recent ED visit from outside hospital on 3/5, had normal MRA head and neck on 3/4  Physical Exam: Physical exam performed. The pertinent findings include: Mildly tachycardic, otherwise normal vital signs.  Abdomen soft with suprapubic tenderness to palpation.  Heart regular rate rhythm, lung sounds clear.  Lab Tests/Imaging studies: I personally interpreted labs/imaging and the pertinent results include: CBC normal.  CMP with mildly elevated glucose, otherwise unremarkable.  UA with rare bacteria, budding yeast, and amorphous crystals.  Normal lipase. Troponin negative.   CT abdomen pelvis shows diffuse colitis.  I agree with the radiologist interpretation.  Cardiac monitoring: EKG obtained and interpreted by myself and attending physician which shows: Sinus rhythm, nonspecific ST changes   Medications: I ordered medication including IV fluids, Zofran, Protonix,  toradol, bentyl.  I have reviewed the patients home medicines and have made adjustments as needed.   Disposition: After consideration of the diagnostic results and the patients response to treatment, I feel that emergency department workup does not suggest an emergent condition requiring admission or immediate intervention beyond what has been performed at this time. The plan is: discharge to home. Suspect vomiting and diarrhea related to colitis, probably viral in origin. Will send prescriptions for antiemetics, antidiarrheals and bentyl. The patient is safe for discharge and has been instructed to return immediately for worsening symptoms, change in symptoms or any other concerns.  Final Clinical Impression(s) /  ED Diagnoses Final diagnoses:  Nausea vomiting and diarrhea  Generalized abdominal pain  Colitis    Rx / DC Orders ED Discharge Orders          Ordered    loperamide (IMODIUM) 2 MG capsule  4 times daily PRN        08/28/23 1445    ondansetron (ZOFRAN-ODT) 4 MG disintegrating tablet  Every 8 hours PRN        08/28/23 1445    pantoprazole (PROTONIX) 20 MG tablet  Daily        08/28/23 1445    dicyclomine (BENTYL) 20 MG tablet  2 times daily        08/28/23 1445           Portions of this report may have been transcribed using voice recognition software. Every effort was made to ensure accuracy; however, inadvertent computerized transcription errors may be present.    Jeanella Flattery 08/28/23 1448    Terrilee Files, MD 08/28/23 [id number]

## 2023-08-28 NOTE — ED Triage Notes (Signed)
 Pt c/o lower abdominal pain that started last night  Pt took 2 aspirin before arrival  Pt denies any n/v/d  Pt c/o pain when she tires to urinate

## 2023-08-28 NOTE — Discharge Instructions (Addendum)
 You were seen in the emergency department today for abdominal and chest pain.  Overall your lab work was reassuring.  Your CT scan showed colitis, which is inflammation of your colon.  We gave you nausea medication and medicine to help with abdominal cramping.  I have sent prescriptions for these medicines to your pharmacy on file.  We have also tested you for flu, covid, and rsv and these tests are pending. You can sign up for [facility] MyChart to access your medical records and test results using this link: [email or web address]  I recommend drinking plenty of fluids, and when you feel able to eat, starting with bland foods and working her way up as tolerated.  Continue to monitor how you're doing and return to the ER for new or worsening symptoms.

## 2023-08-28 NOTE — ED Notes (Addendum)
 Patient discharged. Provider spoke to patient. Paperwork given to patient and reviewed. Pt verbalized understanding. VSS. A+Ox4. Patient wheeled out to car by family. Steady gait to chair. IV removed intact without complications.

## 2023-08-30 ENCOUNTER — Encounter: Payer: Self-pay | Admitting: Family Medicine

## 2023-08-30 ENCOUNTER — Ambulatory Visit: Payer: Medicaid Other | Admitting: Family Medicine

## 2023-08-30 VITALS — BP 128/84 | HR 95 | Temp 97.8°F | Resp 18 | Ht 67.0 in | Wt 222.4 lb

## 2023-08-30 DIAGNOSIS — K529 Noninfective gastroenteritis and colitis, unspecified: Secondary | ICD-10-CM

## 2023-08-30 DIAGNOSIS — Z91030 Bee allergy status: Secondary | ICD-10-CM | POA: Diagnosis not present

## 2023-08-30 DIAGNOSIS — I10 Essential (primary) hypertension: Secondary | ICD-10-CM | POA: Diagnosis not present

## 2023-08-30 DIAGNOSIS — L304 Erythema intertrigo: Secondary | ICD-10-CM | POA: Diagnosis not present

## 2023-08-30 MED ORDER — NYSTATIN 100000 UNIT/GM EX POWD: 1.0000 | Freq: Three times a day (TID) | CUTANEOUS | 0 refills | Status: DC | PRN

## 2023-08-30 MED ORDER — EPINEPHRINE 0.3 MG/0.3ML IJ SOAJ: 0.3000 mg | INTRAMUSCULAR | 5 refills | Status: DC | PRN

## 2023-08-30 MED ORDER — ONDANSETRON 4 MG PO TBDP: 4.0000 mg | ORAL_TABLET | Freq: Three times a day (TID) | ORAL | 0 refills | Status: DC | PRN

## 2023-08-30 NOTE — Progress Notes (Signed)
 Established Patient Office Visit  Subjective   Patient ID: Alicia Hardy, female    DOB: 05/16/1976  Age: 48 y.o. MRN: [id number]  Chief Complaint  Patient presents with   Medical Management of Chronic Issues    Patient is here for a 1 month follow up for HTN, patient states that she still has headaches and her BP has been up and down.  She would also like to discuss blood in her stool that she states started after she was given Naproxen 500 mg.     HPI  Stomach virus Pt reports she was in the ER last Saturday for Nausea/vomiting and diarrhea. She had CT scan and showed colitis. She was given Zofran and eating mashed potatoes, applesauce, and jello. She needs refill on her Zofran. She also reports she was given Naproxen to take before this visit for neck pain. She noticed blood in her stool after taking.   Anxiety Pt was referred to [facility] and has seen [facility] since last visit. She was given Hydroxyzine 10mg  for nighttime use.  Intertrigo Pt reports she has worsening intertrigo in her groin. She needs her Nystatin refilled. She also has allergy to bee stings and as it approaches spring, she needs her epipen refilled.  Hypertension Pt was recently started on Amlodipine 5mg  daily. Blood pressures are much better and stable. Now at goal. Denies chest pains or SOB.    Review of Systems  Gastrointestinal:  Positive for diarrhea, nausea and vomiting.  Skin:  Positive for rash.  All other systems reviewed and are negative.    Objective:     BP 128/84   Pulse 95   Temp 97.8 F (36.6 C) (Oral)   Resp 18   Ht 5\' 7"  (1.702 m)   Wt 222 lb 6.4 oz (100.9 kg)   SpO2 100%   BMI 34.83 kg/m    Physical Exam Vitals and nursing note reviewed.  Constitutional:      Appearance: Normal appearance. She is normal weight.  HENT:     Head: Normocephalic and atraumatic.     Right Ear: External ear normal.     Left Ear: External ear normal.     Nose: Nose normal.     Mouth/Throat:      Mouth: Mucous membranes are moist.     Pharynx: Oropharynx is clear.  Eyes:     Conjunctiva/sclera: Conjunctivae normal.     Pupils: Pupils are equal, round, and reactive to light.  Cardiovascular:     Rate and Rhythm: Normal rate.  Pulmonary:     Effort: Pulmonary effort is normal.  Skin:    General: Skin is warm.     Capillary Refill: Capillary refill takes less than 2 seconds.  Neurological:     General: No focal deficit present.     Mental Status: She is alert and oriented to person, place, and time. Mental status is at baseline.  Psychiatric:        Mood and Affect: Mood normal.        Behavior: Behavior normal.        Thought Content: Thought content normal.        Judgment: Judgment normal.    No results found for any visits on 08/30/23.    The 10-year ASCVD risk score (Arnett DK, et al., 2019) is: 5.3%    Assessment & Plan:   Problem List Items Addressed This Visit   None   Primary hypertension  Bee sting allergy -  EPINEPHrine; Inject 0.3 mg into the muscle as needed for anaphylaxis.  Dispense: 1 each; Refill: 5  Intertrigo -     Nystatin; Apply 1 Application topically 3 (three) times daily as needed.  Dispense: 21 g; Refill: 0  Gastroenteritis -     Ondansetron; Take 1 tablet (4 mg total) by mouth every 8 (eight) hours as needed for nausea or vomiting.  Dispense: 20 tablet; Refill: 0   Pt with blood pressure now controlled and stable. Continue Amlodipine 5mg  daily.  Refilled Epipen for bee sting allergy. Refilled nystatin powder for recurrent intertrigo. For stomach virus, refilled Zofran. Printed out clear liquid/soft diet to follow. Also advised to stop the Naproxen and all other nsaids for now. Will monitor. If stomach virus resolves and she continues to have blood in the stool, she is to let me know. Counseled pt that this is normal with colitis symptoms.  No follow-ups on file.    Suzan Slick, MD

## 2023-09-06 ENCOUNTER — Telehealth: Payer: Self-pay | Admitting: Physician Assistant

## 2023-09-06 NOTE — Telephone Encounter (Signed)
 Called by [facility] [employer] that EMS had been called 09/15/23 and found pt deceased. No foul play. Suspected Cardiac Arrest.

## 2023-09-16 NOTE — Telephone Encounter (Signed)
 Please inform Graylin Shiver from [facility] home that I've searched this case by name and case ID. Nothing comes up. There's also no messages in my basket. Can they send me the case? Im unable to locate this to complete.

## 2023-09-16 NOTE — Telephone Encounter (Signed)
 Alicia Hardy called from [facility] asking for a death certificate to be signed on [name] Case I.D.# [id number]. She expired on October 02, 2023.

## 2023-09-21 DEATH — deceased

## 2023-10-22 ENCOUNTER — Ambulatory Visit: Payer: Medicaid Other | Admitting: Podiatry
